# Patient Record
Sex: Female | Born: 1990 | Race: White | Hispanic: No | Marital: Single | Smoking: Current every day smoker
Health system: Southern US, Community
[De-identification: ages and names within clinical notes are randomized; demographics above are authoritative.]

## PROBLEM LIST (undated history)

## (undated) DIAGNOSIS — M40209 Unspecified kyphosis, site unspecified: Secondary | ICD-10-CM

## (undated) HISTORY — PX: TONSILLECTOMY: SUR1361

---

## 2010-05-16 ENCOUNTER — Emergency Department: Payer: Self-pay | Admitting: Emergency Medicine

## 2010-05-16 ENCOUNTER — Ambulatory Visit: Payer: Self-pay | Admitting: Internal Medicine

## 2011-02-08 DIAGNOSIS — M419 Scoliosis, unspecified: Secondary | ICD-10-CM | POA: Insufficient documentation

## 2011-02-08 DIAGNOSIS — F17200 Nicotine dependence, unspecified, uncomplicated: Secondary | ICD-10-CM | POA: Diagnosis present

## 2012-02-26 IMAGING — CT CT ABD-PELV W/ CM
1 of 2 series · 15 of 32 positions shown, 19 images · IV contrast (isovue)
Comparison: none

REASON FOR EXAM: (1) pain; (2) pain;    NOTE: Nursing to Give Oral CT
Contrast
COMMENTS:

PROCEDURE:     CT  - CT ABDOMEN / PELVIS  W  - May 16, 2010  [DATE]
RESULT:     Comparison:  None
TECHNIQUE: Multiple axial images of the abdomen and pelvis were performed
from the lung bases to the pubic symphysis, with p.o. contrast and with 100
mL of Isovue 370 intravenous contrast.

[Series 2: appendicitis · axial · 0.65mm/px · z∈[-487,-55]mm · 15 of 158 slices shown, 19 images]
[im 7/158  soft-tissue]
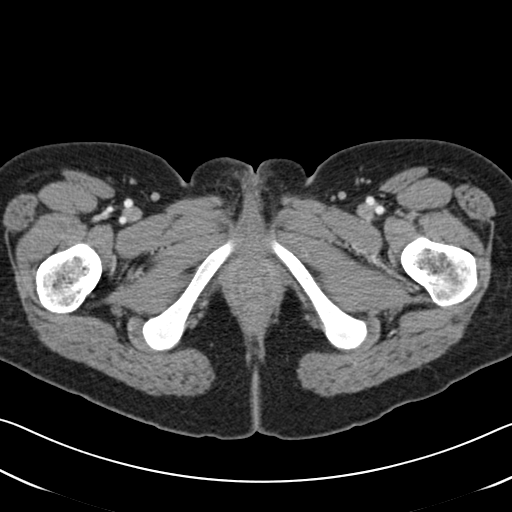
[im 7/158  bone]
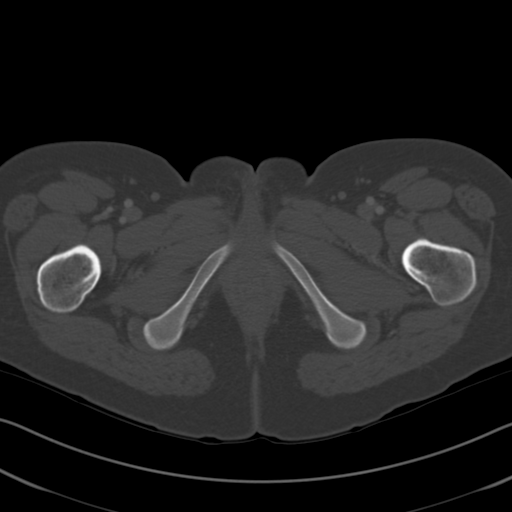
[im 21/158  soft-tissue]
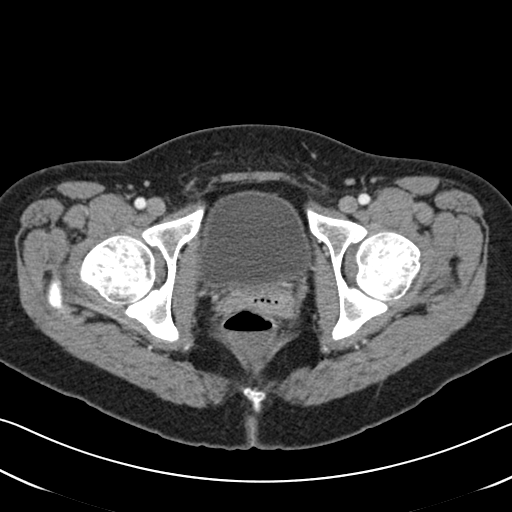
[im 35/158  soft-tissue]
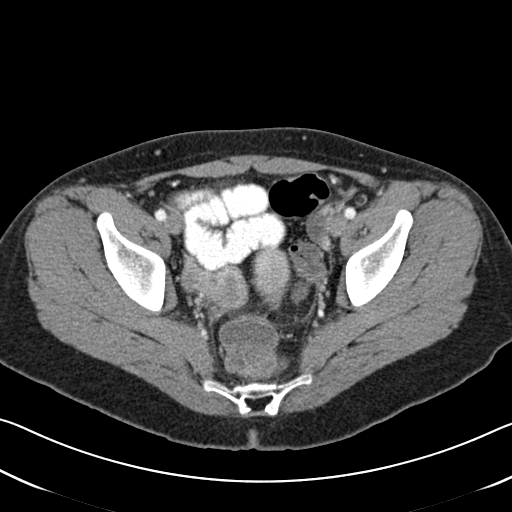
[im 41/158  soft-tissue]
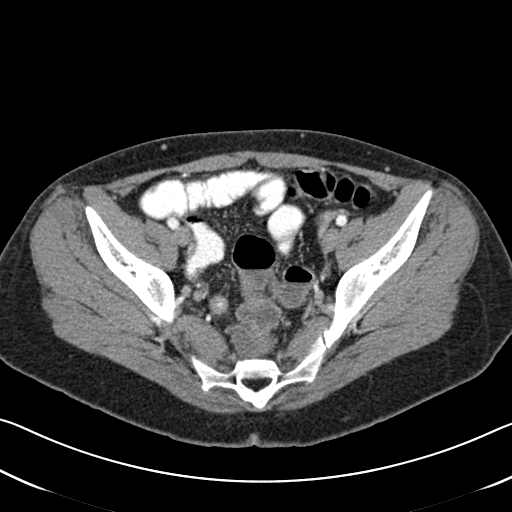
[im 55/158  soft-tissue]
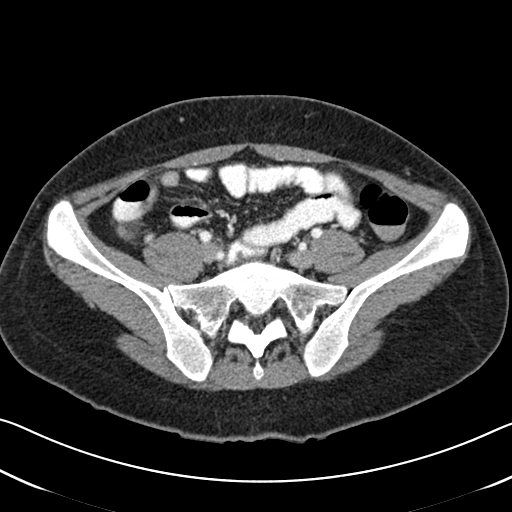
[im 69/158  soft-tissue]
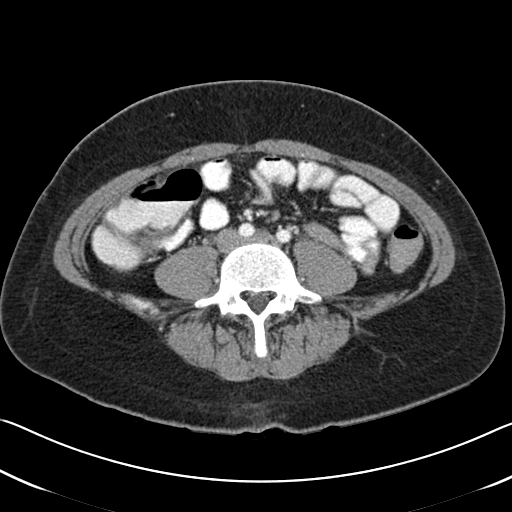
[im 82/158  soft-tissue]
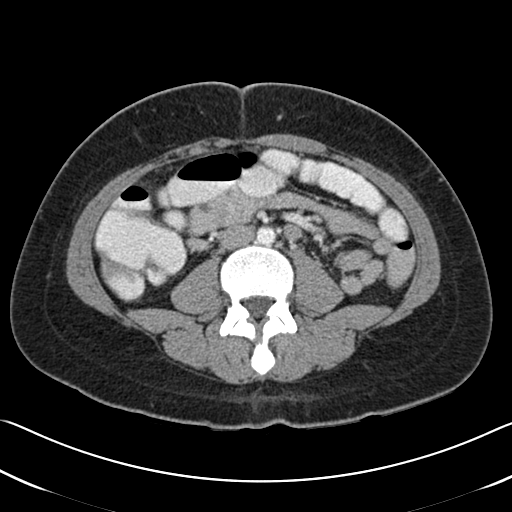
[im 89/158  soft-tissue]
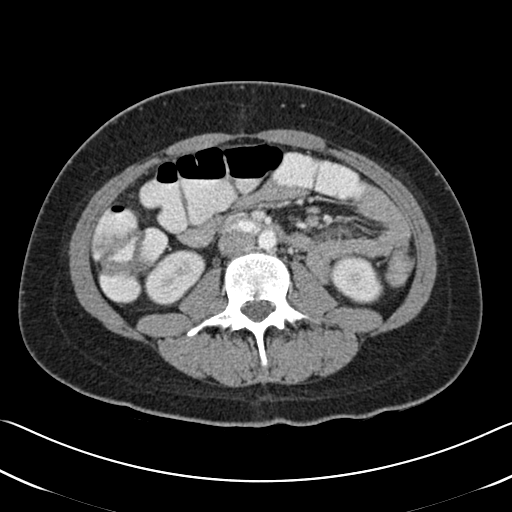
[im 103/158  soft-tissue]
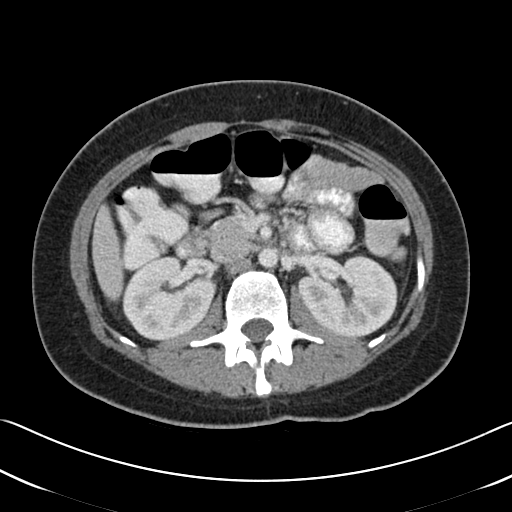
[im 103/158  bone]
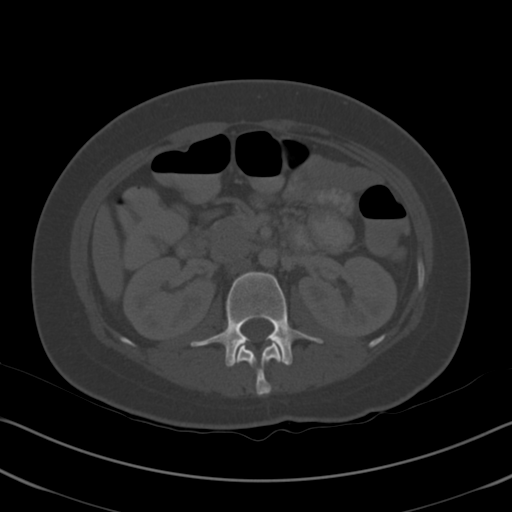
[im 117/158  soft-tissue]
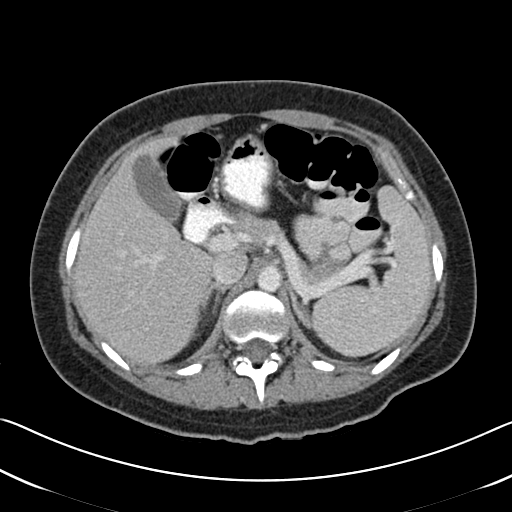
[im 123/158  soft-tissue]
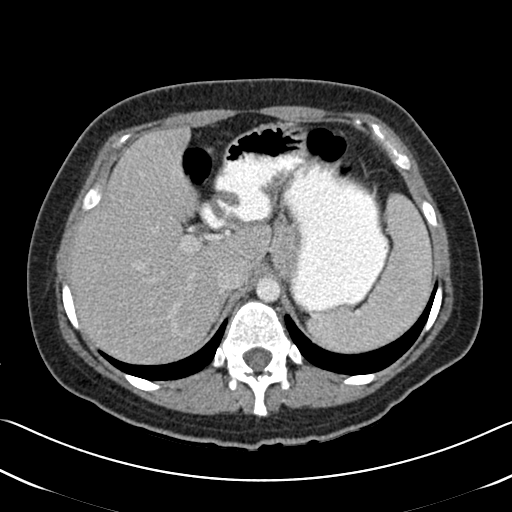
[im 130/158  lung]
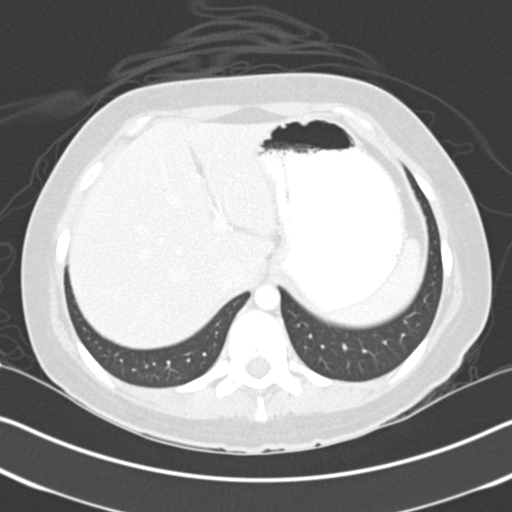
[im 137/158  soft-tissue]
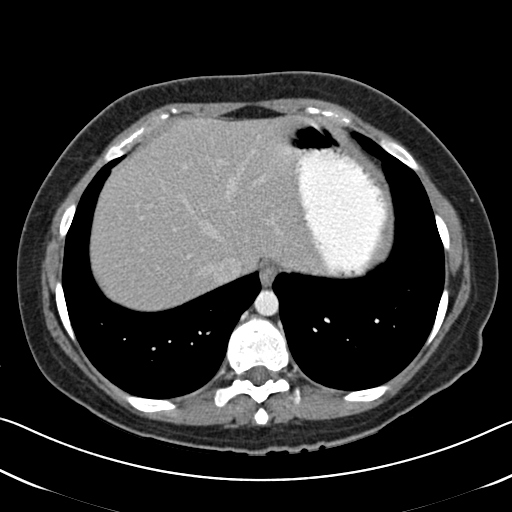
[im 137/158  lung]
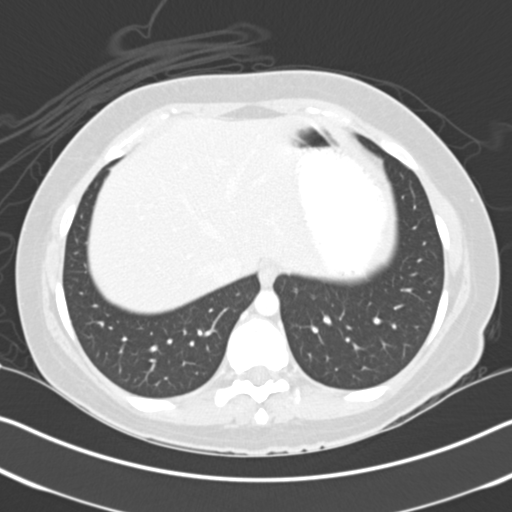
[im 144/158  lung]
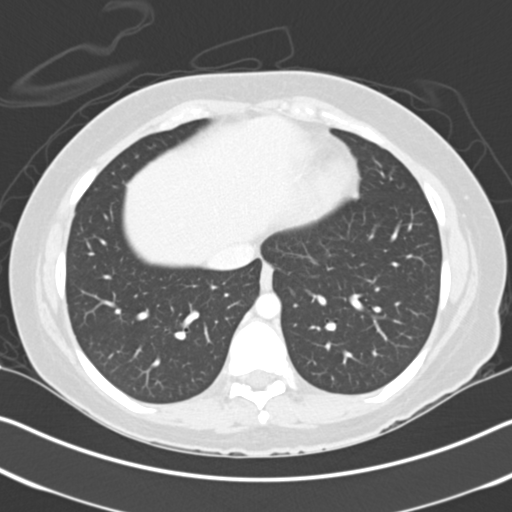
[im 151/158  soft-tissue]
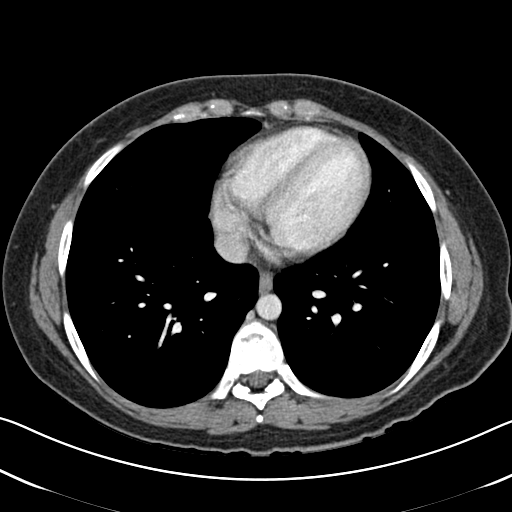
[im 151/158  lung]
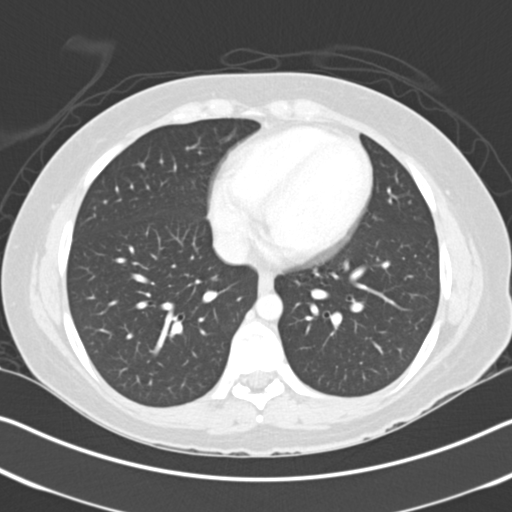

[15 of 32 positions shown; findings below may reference images not displayed]

FINDINGS: Minimal low-attenuation along the falciform ligament likely represents focal
fatty deposition. Small low-attenuation lesion right hepatic lobe is too
small to characterize. The gallbladder, spleen, adrenals, and pancreas are
unremarkable. The kidneys enhance normally. No hydronephrosis.

The small and large bowel are normal in caliber. The appendix measures 6 mm
in diameter, which is at the upper limits of normal in size. There are no
adjacent inflammatory changes. There is a trace amount of free fluid in the
pelvis, which is likely physiologic.

No aggressive lytic or sclerotic osseous lesions identified.
IMPRESSION: 1. The appendix is at the upper limits normal in size. However, there are no
adjacent inflammatory changes. Correlate clinically for early appendicitis.
2. Otherwise, no acute findings in the abdomen or pelvis.

The preliminary report was faxed to the ordering clinician at the time of
the study.

## 2014-11-09 ENCOUNTER — Emergency Department
Admission: EM | Admit: 2014-11-09 | Discharge: 2014-11-09 | Disposition: A | Discharge status: Home / Self Care | Payer: BLUE CROSS/BLUE SHIELD | Attending: Emergency Medicine | Admitting: Emergency Medicine

## 2014-11-09 ENCOUNTER — Encounter: Payer: Self-pay | Admitting: Emergency Medicine

## 2014-11-09 DIAGNOSIS — Z3202 Encounter for pregnancy test, result negative: Secondary | ICD-10-CM | POA: Insufficient documentation

## 2014-11-09 DIAGNOSIS — R509 Fever, unspecified: Secondary | ICD-10-CM

## 2014-11-09 DIAGNOSIS — R000 Tachycardia, unspecified: Secondary | ICD-10-CM | POA: Insufficient documentation

## 2014-11-09 DIAGNOSIS — Z720 Tobacco use: Secondary | ICD-10-CM | POA: Diagnosis not present

## 2014-11-09 DIAGNOSIS — E860 Dehydration: Secondary | ICD-10-CM | POA: Diagnosis not present

## 2014-11-09 DIAGNOSIS — N3090 Cystitis, unspecified without hematuria: Secondary | ICD-10-CM | POA: Diagnosis not present

## 2014-11-09 DIAGNOSIS — R251 Tremor, unspecified: Secondary | ICD-10-CM | POA: Diagnosis present

## 2014-11-09 LAB — CBC WITH DIFFERENTIAL/PLATELET
BASOS ABS: 0 10*3/uL (ref 0–0.1)
BASOS PCT: 0 %
EOS ABS: 0 10*3/uL (ref 0–0.7)
EOS PCT: 1 %
HCT: 35.7 % (ref 35.0–47.0)
HEMOGLOBIN: 12.2 g/dL (ref 12.0–16.0)
LYMPHS PCT: 13 %
Lymphs Abs: 1 10*3/uL (ref 1.0–3.6)
MCH: 31.2 pg (ref 26.0–34.0)
MCHC: 34.3 g/dL (ref 32.0–36.0)
MCV: 90.9 fL (ref 80.0–100.0)
Monocytes Absolute: 0.1 10*3/uL — ABNORMAL LOW (ref 0.2–0.9)
Monocytes Relative: 1 %
NEUTROS ABS: 6.6 10*3/uL — AB (ref 1.4–6.5)
Neutrophils Relative %: 85 %
PLATELETS: 186 10*3/uL (ref 150–440)
RBC: 3.92 MIL/uL (ref 3.80–5.20)
RDW: 12.4 % (ref 11.5–14.5)
WBC: 7.9 10*3/uL (ref 3.6–11.0)

## 2014-11-09 LAB — COMPREHENSIVE METABOLIC PANEL
ALBUMIN: 3.6 g/dL (ref 3.5–5.0)
ALK PHOS: 89 U/L (ref 38–126)
ALT: 63 U/L — AB (ref 14–54)
AST: 114 U/L — AB (ref 15–41)
Anion gap: 4 — ABNORMAL LOW (ref 5–15)
BUN: 10 mg/dL (ref 6–20)
CALCIUM: 8.2 mg/dL — AB (ref 8.9–10.3)
CHLORIDE: 111 mmol/L (ref 101–111)
CO2: 24 mmol/L (ref 22–32)
CREATININE: 0.85 mg/dL (ref 0.44–1.00)
GFR calc non Af Amer: >60 mL/min (ref >60)
GLUCOSE: 104 mg/dL — AB (ref 65–99)
Potassium: 3.6 mmol/L (ref 3.5–5.1)
SODIUM: 139 mmol/L (ref 135–145)
Total Bilirubin: 0.7 mg/dL (ref 0.3–1.2)
Total Protein: 6.3 g/dL — ABNORMAL LOW (ref 6.5–8.1)

## 2014-11-09 LAB — URINALYSIS COMPLETE WITH MICROSCOPIC (ARMC ONLY)
Bilirubin Urine: NEGATIVE
GLUCOSE, UA: NEGATIVE mg/dL
Ketones, ur: NEGATIVE mg/dL
Nitrite: NEGATIVE
PROTEIN: 30 mg/dL — AB
SPECIFIC GRAVITY, URINE: 1.019 (ref 1.005–1.030)
pH: 5 (ref 5.0–8.0)

## 2014-11-09 LAB — PREGNANCY, URINE: PREG TEST UR: NEGATIVE

## 2014-11-09 MED ORDER — KETOROLAC TROMETHAMINE 30 MG/ML IJ SOLN
30.0000 mg | Freq: Once | INTRAMUSCULAR | Status: AC
  Administered 2014-11-09: 30 mg via INTRAVENOUS

## 2014-11-09 MED ORDER — DEXTROSE 5 % IV SOLN
1.0000 g | Freq: Once | INTRAVENOUS | Status: AC
  Administered 2014-11-09: 1 g via INTRAVENOUS
  Filled 2014-11-09: qty 10

## 2014-11-09 MED ORDER — SULFAMETHOXAZOLE-TRIMETHOPRIM 800-160 MG PO TABS: 1.0000 | ORAL_TABLET | Freq: Two times a day (BID) | ORAL | Status: DC

## 2014-11-09 MED ORDER — KETOROLAC TROMETHAMINE 30 MG/ML IJ SOLN
INTRAMUSCULAR | Status: AC
Start: 2014-11-09 — End: 2014-11-09
  Administered 2014-11-09: 30 mg via INTRAVENOUS
  Filled 2014-11-09: qty 1

## 2014-11-09 MED ORDER — SODIUM CHLORIDE 0.9 % IV BOLUS (SEPSIS)
1000.0000 mL | Freq: Once | INTRAVENOUS | Status: AC
  Administered 2014-11-09: 1000 mL via INTRAVENOUS

## 2014-11-09 MED ORDER — ONDANSETRON HCL 4 MG/2ML IJ SOLN
INTRAMUSCULAR | Status: DC
Start: 2014-11-09 — End: 2014-11-09
  Filled 2014-11-09: qty 2

## 2014-11-09 NOTE — ED Notes (Signed)
Pt. Walked into triage with mother.   Pt. States waking up about half hour ago shaking for about 30 minutes.  Pt. States shaking stopped in route to ED.  Pt. States having a fever before going to bed tonight.  Pt. States taking some Nyquil before going to bed.  Pt. States mild lower lt. Back pain early today.

## 2014-11-09 NOTE — ED Notes (Signed)
MD stafford at bedside. 

## 2014-11-09 NOTE — ED Notes (Signed)
POC PREG NEGATIVE  

## 2014-11-09 NOTE — ED Notes (Signed)
Pt given urine cup and wipe, instructed upon how to use. Verbalized understanding, will let RN know once able to provide sample.

## 2014-11-09 NOTE — Discharge Instructions (Signed)
Fever, Adult A fever is an increase in the body's temperature. It is often defined as a temperature of 100 F (38C) or higher. Short mild or moderate fevers often have no long-term effects. They also often do not need treatment. Moderate or high fevers may make you feel uncomfortable. Sometimes, they can also be a sign of a serious illness or disease. The sweating that may happen with repeated fevers or fevers that last a while may also cause you to not have enough fluid in your body (dehydration). You can take your temperature with a thermometer to see if you have a fever. A measured temperature can change with:  Age.  Time of day.  Where the thermometer is placed:  Mouth (oral).  Rectum (rectal).  Ear (tympanic).  Underarm (axillary).  Forehead (temporal). HOME CARE Pay attention to any changes in your symptoms. Take these actions to help with your condition:  Take over-the-counter and prescription medicines only as told by your doctor. Follow the dosing instructions carefully.  If you were prescribed an antibiotic medicine, take it as told by your doctor. Do not stop taking the antibiotic even if you start to feel better.  Rest as needed.  Drink enough fluid to keep your pee (urine) clear or pale yellow.  Sponge yourself or bathe with room-temperature water as needed. This helps to lower your body temperature . Do not use ice water.  Do not wear too many blankets or heavy clothes. GET HELP IF:  You throw up (vomit).  You cannot eat or drink without throwing up.  You have watery poop (diarrhea).  It hurts when you pee.  Your symptoms do not get better with treatment.  You have new symptoms.  You feel very weak. GET HELP RIGHT AWAY IF:  You are short of breath or have trouble breathing.  You are dizzy or you pass out (faint).  You feel confused.  You have signs of not having enough fluid in your body, such as:  A dry mouth.  Peeing less.  Looking  pale.  You have very bad pain in your belly (abdomen).  You keep throwing up or having water poop.  You have a skin rash.  Your symptoms suddenly get worse.   This information is not intended to replace advice given to you by your health care provider. Make sure you discuss any questions you have with your health care provider.   Document Released: 10/07/2007 Document Revised: 09/18/2014 Document Reviewed: 02/21/2014 Elsevier Interactive Patient Education 2016 Else[employer] Inc[employer]  Dehydration, Adult Dehydration is a condition in which you do not have enough fluid or water in your body. It happens when you take in less fluid than you lose. Vital organs such as the kidneys, brain, and heart cannot function without a proper amount of fluids. Any loss of fluids from the body can cause dehydration.  Dehydration can range from mild to severe. This condition should be treated right away to help prevent it from becoming severe. CAUSES  This condition may be caused by:  Vomiting.  Diarrhea.  Excessive sweating, such as when exercising in hot or humid weather.  Not drinking enough fluid during strenuous exercise or during an illness.  Excessive urine output.  Fever.  Certain medicines. RISK FACTORS This condition is more likely to develop in:  People who are taking certain medicines that cause the body to lose excess fluid (diuretics).   People who have a chronic illness, such as diabetes, that may increase urination.  Older  adults.   People who live at high altitudes.   People who participate in endurance sports.  SYMPTOMS  Mild Dehydration  Thirst.  Dry lips.  Slightly dry mouth.  Dry, warm skin. Moderate Dehydration  Very dry mouth.   Muscle cramps.   Dark urine and decreased urine production.   Decreased tear production.   Headache.   Light-headedness, especially when you stand up from a sitting position.  Severe Dehydration  Changes in skin.    Cold and clammy skin.   Skin does not spring back quickly when lightly pinched and released.   Changes in body fluids.   Extreme thirst.   No tears.   Not able to sweat when body temperature is high, such as in hot weather.   Minimal urine production.   Changes in vital signs.   Rapid, weak pulse (more than 100 beats per minute when you are sitting still).   Rapid breathing.   Low blood pressure.   Other changes.   Sunken eyes.   Cold hands and feet.   Confusion.  Lethargy and difficulty being awakened.  Fainting (syncope).   Short-term weight loss.   Unconsciousness. DIAGNOSIS  This condition may be diagnosed based on your symptoms. You may also have tests to determine how severe your dehydration is. These tests may include:   Urine tests.   Blood tests.  TREATMENT  Treatment for this condition depends on the severity. Mild or moderate dehydration can often be treated at home. Treatment should be started right away. Do not wait until dehydration becomes severe. Severe dehydration needs to be treated at the hospital. Treatment for Mild Dehydration  Drinking plenty of water to replace the fluid you have lost.   Replacing minerals in your blood (electrolytes) that you may have lost.  Treatment for Moderate Dehydration  Consuming oral rehydration solution (ORS). Treatment for Severe Dehydration  Receiving fluid through an IV tube.   Receiving electrolyte solution through a feeding tube that is passed through your nose and into your stomach (nasogastric tube or NG tube).  Correcting any abnormalities in electrolytes. HOME CARE INSTRUCTIONS   Drink enough fluid to keep your urine clear or pale yellow.   Drink water or fluid slowly by taking small sips. You can also try sucking on ice cubes.  Have food or beverages that contain electrolytes. Examples include bananas and sports drinks.  Take over-the-counter and prescription  medicines only as told by your health care provider.   Prepare ORS according to the manufacturer's instructions. Take sips of ORS every 5 minutes until your urine returns to normal.  If you have vomiting or diarrhea, continue to try to drink water, ORS, or both.   If you have diarrhea, avoid:   Beverages that contain caffeine.   Fruit juice.   Milk.   Carbonated soft drinks.  Do not take salt tablets. This can lead to the condition of having too much sodium in your body (hypernatremia).  SEEK MEDICAL CARE IF:  You cannot eat or drink without vomiting.  You have had moderate diarrhea during a period of more than 24 hours.  You have a fever. SEEK IMMEDIATE MEDICAL CARE IF:   You have extreme thirst.  You have severe diarrhea.  You have not urinated in 6-8 hours, or you have urinated only a small amount of very dark urine.  You have shriveled skin.  You are dizzy, confused, or both.   This information is not intended to replace advice given to you  by your health care provider. Make sure you discuss any questions you have with your health care provider.   Document Released: 12/28/2004 Document Revised: 09/18/2014 Document Reviewed: 05/15/2014 Elsevier Interactive Patient Education 2016 Elsevier Inc.  Urinary Tract Infection Urinary tract infections (UTIs) can develop anywhere along your urinary tract. Your urinary tract is your body's drainage system for removing wastes and extra water. Your urinary tract includes two kidneys, two ureters, a bladder, and a urethra. Your kidneys are a pair of bean-shaped organs. Each kidney is about the size of your fist. They are located below your ribs, one on each side of your spine. CAUSES Infections are caused by microbes, which are microscopic organisms, including fungi, viruses, and bacteria. These organisms are so small that they can only be seen through a microscope. Bacteria are the microbes that most commonly cause  UTIs. SYMPTOMS  Symptoms of UTIs may vary by age and gender of the patient and by the location of the infection. Symptoms in young women typically include a frequent and intense urge to urinate and a painful, burning feeling in the bladder or urethra during urination. Older women and men are more likely to be tired, shaky, and weak and have muscle aches and abdominal pain. A fever may mean the infection is in your kidneys. Other symptoms of a kidney infection include pain in your back or sides below the ribs, nausea, and vomiting. DIAGNOSIS To diagnose a UTI, your caregiver will ask you about your symptoms. Your caregiver will also ask you to provide a urine sample. The urine sample will be tested for bacteria and white blood cells. White blood cells are made by your body to help fight infection. TREATMENT  Typically, UTIs can be treated with medication. Because most UTIs are caused by a bacterial infection, they usually can be treated with the use of antibiotics. The choice of antibiotic and length of treatment depend on your symptoms and the type of bacteria causing your infection. HOME CARE INSTRUCTIONS  If you were prescribed antibiotics, take them exactly as your caregiver instructs you. Finish the medication even if you feel better after you have only taken some of the medication.  Drink enough water and fluids to keep your urine clear or pale yellow.  Avoid caffeine, tea, and carbonated beverages. They tend to irritate your bladder.  Empty your bladder often. Avoid holding urine for long periods of time.  Empty your bladder before and after sexual intercourse.  After a bowel movement, women should cleanse from front to back. Use each tissue only once. SEEK MEDICAL CARE IF:   You have back pain.  You develop a fever.  Your symptoms do not begin to resolve within 3 days. SEEK IMMEDIATE MEDICAL CARE IF:   You have severe back pain or lower abdominal pain.  You develop  chills.  You have nausea or vomiting.  You have continued burning or discomfort with urination. MAKE SURE YOU:   Understand these instructions.  Will watch your condition.  Will get help right away if you are not doing well or get worse.   This information is not intended to replace advice given to you by your health care provider. Make sure you discuss any questions you have with your health care provider.   Document Released: 10/07/2004 Document Revised: 09/18/2014 Document Reviewed: 02/05/2011 Elsevier Interactive Patient Education [employer] Elsevier Inc.

## 2014-11-09 NOTE — ED Provider Notes (Signed)
[facility] Emergency Department Provider Note  ____________________________________________  Time seen: 2:00 AM  I have reviewed the triage vital signs and the nursing notes.   HISTORY  Chief Complaint Shaking    HPI Katie Watson is a 24 y.o. female who reports a fever when going to bed tonight associated with some lower back pain. She took some NyQuil but then woke up shortly before presentation to the emergency department shaking. Denies dysuria frequency urgency hematuria. Has had history of urinary tract infections. No other symptoms recently.     No past medical history on file.   There are no active problems to display for this patient.    Past Surgical History  Procedure Laterality Date  . Tonsillectomy       Current Outpatient Rx  Name  Route  Sig  Dispense  Refill  . Aspirin-Salicylamide-Caffeine (BC HEADACHE POWDER PO)   Oral   Take 1 Dose by mouth daily as needed.         . etonogestrel (NEXPLANON) 68 MG IMPL implant   Intradermal   Inject 1 each into the skin as directed.         . sulfamethoxazole-trimethoprim (BACTRIM DS) 800-160 MG tablet   Oral   Take 1 tablet by mouth 2 (two) times daily.   14 tablet   0      Allergies Pertussis immune globulin   No family history on file.  Social History Social History  Substance Use Topics  . Smoking status: Current Every Day Smoker -- 0.50 packs/day    Types: Cigarettes  . Smokeless tobacco: None  . Alcohol Use: Yes    Review of Systems  Constitutional:   Positive fever and chills.. No weight changes Eyes:   No blurry vision or double vision.  ENT:   No sore throat. Cardiovascular:   No chest pain. Respiratory:   No dyspnea or cough. Gastrointestinal:   Negative for abdominal pain, vomiting and diarrhea.  No BRBPR or melena. Genitourinary:   Negative for dysuria, urinary retention, bloody urine, or difficulty urinating. Musculoskeletal:   Positive low  back pain. Skin:   Negative for rash. Neurological:   Negative for headaches, focal weakness or numbness. Psychiatric:  No anxiety or depression.   Endocrine:  No hot/cold intolerance, changes in energy, or sleep difficulty.  10-point ROS otherwise negative.  ____________________________________________   PHYSICAL EXAM:  VITAL SIGNS: ED Triage Vitals  Enc Vitals Group     BP 11/09/14 0138 125/74 mmHg     Pulse Rate 11/09/14 0138 110     Resp 11/09/14 0138 18     Temp 11/09/14 0146 100.9 F (38.3 C)     Temp Source 11/09/14 0146 Oral     SpO2 11/09/14 0138 99 %     Weight 11/09/14 0138 176 lb (79.833 kg)     Height 11/09/14 0138 5\' 5"  (1.651 m)     Head Cir --      Peak Flow --      Pain Score --      Pain Loc --      Pain Edu? --      Excl. in GC? --      Constitutional:   Alert and oriented. Well appearing and in no distress. Eyes:   No scleral icterus. No conjunctival pallor. PERRL. EOMI ENT   Head:   Normocephalic and atraumatic.   Nose:   No congestion/rhinnorhea. No septal hematoma   Mouth/Throat:   Dry mucous membranes,  no pharyngeal erythema. No peritonsillar mass. No uvula shift.   Neck:   No stridor. No SubQ emphysema. No meningismus. Hematological/Lymphatic/Immunilogical:   No cervical lymphadenopathy. Cardiovascular:   Tachycardia heart rate 110, increases to 125 when sitting upright. Normal and symmetric distal pulses are present in all extremities. No murmurs, rubs, or gallops. Respiratory:   Normal respiratory effort without tachypnea nor retractions. Breath sounds are clear and equal bilaterally. No wheezes/rales/rhonchi. Gastrointestinal:   Soft with marked. Suprapubic tenderness. No distention. There is no CVA tenderness.  No rebound, rigidity, or guarding. Genitourinary:   deferred Musculoskeletal:   Nontender with normal range of motion in all extremities. No joint effusions.  No lower extremity tenderness.  No edema. Neurologic:    Normal speech and language.  CN 2-10 normal. Motor grossly intact. No pronator drift.  Normal gait. No gross focal neurologic deficits are appreciated.  Skin:    Skin is warm, dry and intact. No rash noted.  No petechiae, purpura, or bullae. Psychiatric:   Mood and affect are normal. Speech and behavior are normal. Patient exhibits appropriate insight and judgment.  ____________________________________________    LABS (pertinent positives/negatives) (all labs ordered are listed, but only abnormal results are displayed) Labs Reviewed  URINALYSIS COMPLETEWITH MICROSCOPIC ([facility] ONLY) - Abnormal; Notable for the following:    Color, Urine YELLOW (*)    APPearance HAZY (*)    Hgb urine dipstick 3+ (*)    Protein, ur 30 (*)    Leukocytes, UA 1+ (*)    Bacteria, UA RARE (*)    Squamous Epithelial / LPF 6-30 (*)    All other components within normal limits  CBC WITH DIFFERENTIAL/PLATELET - Abnormal; Notable for the following:    Neutro Abs 6.6 (*)    Monocytes Absolute 0.1 (*)    All other components within normal limits  COMPREHENSIVE METABOLIC PANEL - Abnormal; Notable for the following:    Glucose, Bld 104 (*)    Calcium 8.2 (*)    Total Protein 6.3 (*)    AST 114 (*)    ALT 63 (*)    Anion gap 4 (*)    All other components within normal limits  URINE CULTURE  PREGNANCY, URINE   urine white blood cells seen numerous to count ____________________________________________   EKG    ____________________________________________    RADIOLOGY    ____________________________________________   PROCEDURES   ____________________________________________   INITIAL IMPRESSION / ASSESSMENT AND PLAN / ED COURSE  Pertinent labs & imaging results that were available during my care of the patient were reviewed by me and considered in my medical decision making (see chart for details).  Patient presents with fever tachycardia and exam highly suggestive of cystitis and  urinary tract infection. Exam is not consistent with pyelonephritis. She does appear to be dehydrated and by history she does not drink a lot of fluids. She is overall very well appearing and young and healthy, so despite the vital sign abnormalities I do not think that she is septic at this time. We'll check labs and give IV fluids.  ----------------------------------------- 4:23 AM on 11/09/2014 -----------------------------------------  Patient very comfortable and well-appearing. Urinalysis shows a clear urinary tract infection, and labs are unremarkable. Kidney function is unremarkable and there is no leukocytosis. Patient given 1 g IV ceftriaxone and urine culture sent. We'll start her on course of Bactrim and have her follow up with primary care in a week. She is in no distress and suitable for outpatient follow-up.  ____________________________________________   FINAL CLINICAL IMPRESSION(S) / ED DIAGNOSES  Final diagnoses:  Cystitis  Fever, unspecified fever cause  Dehydration      Sharman Cheek, MD 11/09/14 [phone]

## 2014-11-11 LAB — URINE CULTURE: Culture: >=100000

## 2016-08-19 ENCOUNTER — Ambulatory Visit
Admission: EM | Admit: 2016-08-19 | Discharge: 2016-08-19 | Disposition: A | Discharge status: Home / Self Care | Payer: Managed Care, Other (non HMO) | Attending: Family Medicine | Admitting: Family Medicine

## 2016-08-19 DIAGNOSIS — M62830 Muscle spasm of back: Secondary | ICD-10-CM

## 2016-08-19 DIAGNOSIS — M5441 Lumbago with sciatica, right side: Secondary | ICD-10-CM

## 2016-08-19 HISTORY — DX: Unspecified kyphosis, site unspecified: M40.209

## 2016-08-19 MED ORDER — ORPHENADRINE CITRATE ER 100 MG PO TB12: 100.0000 mg | ORAL_TABLET | Freq: Two times a day (BID) | ORAL | 0 refills | Status: DC

## 2016-08-19 MED ORDER — MELOXICAM 15 MG PO TABS: 15.0000 mg | ORAL_TABLET | Freq: Every day | ORAL | 0 refills | Status: DC

## 2016-08-19 MED ORDER — KETOROLAC TROMETHAMINE 60 MG/2ML IM SOLN
60.0000 mg | Freq: Once | INTRAMUSCULAR | Status: AC
  Administered 2016-08-19: 60 mg via INTRAMUSCULAR

## 2016-08-19 MED ORDER — PREDNISONE 10 MG (21) PO TBPK: ORAL_TABLET | ORAL | 0 refills | Status: DC

## 2016-08-19 NOTE — ED Provider Notes (Signed)
[facility]    CSN: [id number] Arrival date & time: 08/19/16  1113     History   Chief Complaint Chief Complaint  Patient presents with  . Back Pain    HPI Katie Watson is a 26 y.o. female.   Patient reports back pain. She reports having a history of chronic back pain since high school over 10 years ago. She seemed back experts she was told that she has scoliosis of the spine and is concerned about the way she carried a book bag high school with that may have contributed to her problems. In the last 2 years she's had recurrent trouble with her back. She's had difficulty doing her usual activities and now has gotten worse. 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 Initially she took Motrin that she took Motrin and Tylenol and now she is on [facility] powder to for relief of pain and last longer working. She reports pains in the right side of the back. She reports that is 8 out of 10 now and when she wants at work yesterday it was a 10 out of 10. She does be working and was unable to work because of the amount of pain that she's having her back. Unfortunately she does smoke. She is allergic to tramadol and pertussis immune globulin. No pertinent family medical history relevant to today's visit.   The history is provided by the patient. No language interpreter was used.  Back Pain  Location:  Thoracic spine Quality:  Aching, burning and shooting Radiates to:  R shoulder Pain severity:  Severe Worse during: wrse aftr working. Onset quality:  Sudden Timing:  Constant Progression:  Worsening Chronicity:  New Relieved by:  Nothing Worsened by:  Bending and movement Ineffective treatments:  NSAIDs and OTC medications Associated symptoms: numbness and paresthesias     Past Medical History:  Diagnosis Date  . Kyphosis deformity of spine     There are no active problems to display for this patient.   Past Surgical History:   Procedure Laterality Date  . TONSILLECTOMY      OB History    No data available       Home Medications    Prior to Admission medications   Medication Sig Start Date End Date Taking? Authorizing Provider  Aspirin-Salicylamide-Caffeine (BC HEADACHE POWDER PO) Take 1 Dose by mouth daily as needed.   Yes [provider]  etonogestrel (NEXPLANON) 68 MG IMPL implant Inject 1 each into the skin as directed.    [provider]  meloxicam (MOBIC) 15 MG tablet Take 1 tablet (15 mg total) by mouth daily. 08/19/16   Hassan Rowan, MD  orphenadrine (NORFLEX) 100 MG tablet Take 1 tablet (100 mg total) by mouth 2 (two) times daily. 08/19/16   Hassan Rowan, MD  predniSONE (STERAPRED UNI-PAK 21 TAB) 10 MG (21) TBPK tablet 6 tabs day 1 and 2, 5 tabs day 3 and 4, 4 tabs day 5 and 6, 3 tabs day 7 and 8, 2 tabs day 9 and 10, 1 tab day 11 and 12. Take orally 08/19/16   Hassan Rowan, MD  sulfamethoxazole-trimethoprim (BACTRIM DS) 800-160 MG tablet Take 1 tablet by mouth 2 (two) times daily. 11/09/14   Sharman Cheek, MD    Family History History reviewed. No pertinent family history.  Social History Social History  Substance Use Topics  . Smoking status: Current Every Day Smoker  Packs/day: 0.50    Types: Cigarettes  . Smokeless tobacco: Never Used  . Alcohol use No     Allergies   Pertussis immune globulin   Review of Systems Review of Systems  Musculoskeletal: Positive for back pain, gait problem and myalgias.  Neurological: Positive for numbness and paresthesias.  All other systems reviewed and are negative.    Physical Exam Triage Vital Signs ED Triage Vitals  Enc Vitals Group     BP 08/19/16 1210 117/78     Pulse Rate 08/19/16 1210 81     Resp 08/19/16 1210 17     Temp 08/19/16 1210 98.1 F (36.7 C)     Temp Source 08/19/16 1210 Oral     SpO2 08/19/16 1210 100 %     Weight 08/19/16 1208 162 lb (73.5 kg)     Height 08/19/16 1208 5\' 5"  (1.651 m)     Head  Circumference --      Peak Flow --      Pain Score 08/19/16 1208 8     Pain Loc --      Pain Edu? --      Excl. in GC? --    No data found.   Updated Vital Signs BP 117/78 (BP Location: Right Arm)   Pulse 81   Temp 98.1 F (36.7 C) (Oral)   Resp 17   Ht 5\' 5"  (1.651 m)   Wt 162 lb (73.5 kg)   LMP 08/01/2016   SpO2 100%   BMI 26.96 kg/m   Visual Acuity Right Eye Distance:   Left Eye Distance:   Bilateral Distance:    Right Eye Near:   Left Eye Near:    Bilateral Near:     Physical Exam  Constitutional: She is oriented to person, place, and time. She appears well-developed and well-nourished.  HENT:  Head: Normocephalic.  Eyes: Pupils are equal, round, and reactive to light.  Neck: Normal range of motion. Neck supple.  Pulmonary/Chest: Effort normal.  Musculoskeletal: She exhibits tenderness.       Back:       Arms: Patient with marked muscle spasms along the right thoracic area and lateral to the thoracic spine. To palpation and reproduces the pain and discomfort that she's been having  Neurological: She is alert and oriented to person, place, and time. No cranial nerve deficit.  Skin: She is not diaphoretic.  Psychiatric: She has a normal mood and affect.  Vitals reviewed.    UC Treatments / Results  Labs (all labs ordered are listed, but only abnormal results are displayed) Labs Reviewed - No data to display  EKG  EKG Interpretation None       Radiology No results found.  Procedures Procedures (including critical care time)  Medications Ordered in UC Medications  ketorolac (TORADOL) injection 60 mg (60 mg Intramuscular Given 08/19/16 1341)     Initial Impression / Assessment and Plan / UC Course  I have reviewed the triage vital signs and the nursing notes.  Pertinent labs & imaging results that were available during my care of the patient were reviewed by me and considered in my medical decision making (see chart for details).   will  administer. 60 Toradol IM since the pain is 8 out of 10, place on prednisone for 12 days not repeat x-ray because his been no trauma to the back or place or Norflex 100 mg twice a day Mobic 15 mg a day stopped Goody powders she may take Tylenol  extra strength thousand milligrams twice a day and I'm recommending chiropractic evaluation for possible manipulation of the spine to try to get alignment improved   Final Clinical Impressions(s) / UC Diagnoses   Final diagnoses:  Muscle spasm of back  Acute right-sided low back pain with right-sided sciatica    New Prescriptions Discharge Medication List as of 08/19/2016  2:08 PM    START taking these medications   Details  meloxicam (MOBIC) 15 MG tablet Take 1 tablet (15 mg total) by mouth daily., Starting Thu 08/19/2016, Normal    orphenadrine (NORFLEX) 100 MG tablet Take 1 tablet (100 mg total) by mouth 2 (two) times daily., Starting Thu 08/19/2016, Normal    predniSONE (STERAPRED UNI-PAK 21 TAB) 10 MG (21) TBPK tablet 6 tabs day 1 and 2, 5 tabs day 3 and 4, 4 tabs day 5 and 6, 3 tabs day 7 and 8, 2 tabs day 9 and 10, 1 tab day 11 and 12. Take orally, Normal         Controlled Substance Prescriptions [place] Controlled Substance Registry consulted? Not Applicable   Hassan Rowan, MD 08/19/16 [phone]

## 2016-08-19 NOTE — ED Triage Notes (Signed)
Patient complains of mid back pain. Patient states that she was diagnosed with hyperkyphosis in 2008. 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 Patient states that she has been using BC Powders frequently. Patient states that she almost feels a numbing sensation. Patient states that this has been constant forever but seems to have worsened recently.

## 2019-10-10 ENCOUNTER — Other Ambulatory Visit: Payer: Self-pay

## 2019-10-10 ENCOUNTER — Encounter: Payer: Self-pay | Admitting: Emergency Medicine

## 2019-10-10 ENCOUNTER — Ambulatory Visit
Admission: EM | Admit: 2019-10-10 | Discharge: 2019-10-10 | Disposition: A | Discharge status: Home / Self Care | Payer: Self-pay

## 2019-10-10 DIAGNOSIS — J340 Abscess, furuncle and carbuncle of nose: Secondary | ICD-10-CM

## 2019-10-10 MED ORDER — CLINDAMYCIN HCL 150 MG PO CAPS: 150.0000 mg | ORAL_CAPSULE | Freq: Four times a day (QID) | ORAL | 0 refills | Status: DC

## 2019-10-10 NOTE — ED Triage Notes (Signed)
Pt c/o facial swelling and pain. Started about 3 days ago. She states it felt like a pimple at first then it started making her top teeth hurt. She states she does not have any tooth pain now.

## 2019-10-10 NOTE — Discharge Instructions (Addendum)
Take the Clindamycin 4 times a day with food for 7 days.  Follow-up with Dr. Willeen Cass at [facility]. I have also sent a referral.   Apply warm compresses to the left side of your face for comfort.  If your pain increases, you develop more swelling, or a fever you need to go to the ER.
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
## 2020-01-01 DIAGNOSIS — F1120 Opioid dependence, uncomplicated: Secondary | ICD-10-CM | POA: Diagnosis present

## 2020-03-28 ENCOUNTER — Other Ambulatory Visit: Payer: Self-pay

## 2020-03-28 ENCOUNTER — Emergency Department
Admission: EM | Admit: 2020-03-28 | Discharge: 2020-03-28 | Disposition: A | Discharge status: Home / Self Care | Payer: Self-pay | Attending: Emergency Medicine | Admitting: Emergency Medicine

## 2020-03-28 DIAGNOSIS — Z0283 Encounter for blood-alcohol and blood-drug test: Secondary | ICD-10-CM

## 2020-03-28 DIAGNOSIS — F17210 Nicotine dependence, cigarettes, uncomplicated: Secondary | ICD-10-CM | POA: Insufficient documentation

## 2020-03-28 DIAGNOSIS — Z711 Person with feared health complaint in whom no diagnosis is made: Secondary | ICD-10-CM

## 2020-03-28 LAB — URINE DRUG SCREEN, QUALITATIVE (ARMC ONLY)
Amphetamines, Ur Screen: NOT DETECTED
Barbiturates, Ur Screen: NOT DETECTED
Benzodiazepine, Ur Scrn: NOT DETECTED
Cannabinoid 50 Ng, Ur ~~LOC~~: NOT DETECTED
Cocaine Metabolite,Ur ~~LOC~~: NOT DETECTED
MDMA (Ecstasy)Ur Screen: NOT DETECTED
Methadone Scn, Ur: NOT DETECTED
Opiate, Ur Screen: NOT DETECTED
Phencyclidine (PCP) Ur S: NOT DETECTED
Tricyclic, Ur Screen: NOT DETECTED
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
## 2020-03-28 NOTE — Discharge Instructions (Addendum)
Your urine drug screen was negative for any abnormal substances.

## 2020-05-07 DIAGNOSIS — J4520 Mild intermittent asthma, uncomplicated: Secondary | ICD-10-CM | POA: Insufficient documentation

## 2021-03-19 DIAGNOSIS — O0993 Supervision of high risk pregnancy, unspecified, third trimester: Secondary | ICD-10-CM

## 2021-05-14 ENCOUNTER — Other Ambulatory Visit: Payer: Self-pay | Admitting: Obstetrics and Gynecology

## 2021-05-14 DIAGNOSIS — Z3689 Encounter for other specified antenatal screening: Secondary | ICD-10-CM

## 2021-06-18 ENCOUNTER — Other Ambulatory Visit: Payer: Self-pay

## 2021-06-23 ENCOUNTER — Ambulatory Visit: Payer: Medicaid Other

## 2021-06-25 ENCOUNTER — Ambulatory Visit: Payer: Medicaid Other | Attending: Obstetrics

## 2021-06-25 ENCOUNTER — Other Ambulatory Visit: Payer: Self-pay | Admitting: Obstetrics and Gynecology

## 2021-06-25 ENCOUNTER — Institutional Professional Consult (permissible substitution): Payer: Self-pay

## 2021-06-25 ENCOUNTER — Other Ambulatory Visit: Payer: Self-pay

## 2021-06-25 ENCOUNTER — Ambulatory Visit (HOSPITAL_BASED_OUTPATIENT_CLINIC_OR_DEPARTMENT_OTHER): Payer: Medicaid Other | Admitting: Obstetrics

## 2021-06-25 DIAGNOSIS — Z363 Encounter for antenatal screening for malformations: Secondary | ICD-10-CM | POA: Insufficient documentation

## 2021-06-25 DIAGNOSIS — O99212 Obesity complicating pregnancy, second trimester: Secondary | ICD-10-CM

## 2021-06-25 DIAGNOSIS — Z3689 Encounter for other specified antenatal screening: Secondary | ICD-10-CM

## 2021-06-25 DIAGNOSIS — O99332 Smoking (tobacco) complicating pregnancy, second trimester: Secondary | ICD-10-CM | POA: Insufficient documentation

## 2021-06-25 DIAGNOSIS — O99891 Other specified diseases and conditions complicating pregnancy: Secondary | ICD-10-CM | POA: Insufficient documentation

## 2021-06-25 DIAGNOSIS — Z3A19 19 weeks gestation of pregnancy: Secondary | ICD-10-CM | POA: Diagnosis not present

## 2021-06-25 DIAGNOSIS — O99322 Drug use complicating pregnancy, second trimester: Secondary | ICD-10-CM

## 2021-06-25 NOTE — Progress Notes (Signed)
MFM Note  Katie Watson was seen for a detailed fetal anatomy scan due to maternal obesity with a BMI of 42.    She is currently on Suboxone replacement therapy.  She denies any significant past medical history and denies any problems in her current pregnancy.    She had a cell free DNA test earlier in her pregnancy which indicated a low risk for trisomy 45, 73, and 13. A female fetus is predicted.   She was informed that the fetal growth and amniotic fluid level were appropriate for her gestational age.   On today's exam, an intracardiac echogenic focus was noted in the left ventricle of the fetal heart.    The small association between an echogenic focus and Down syndrome was discussed. Due to the echogenic focus noted today, the patient was offered and declined an amniocentesis today for definitive diagnosis of fetal aneuploidy.  She reports that she is comfortable with her negative cell free DNA test.  The patient was informed that anomalies may be missed due to technical limitations. If the fetus is in a suboptimal position or maternal habitus is increased, visualization of the fetus in the maternal uterus may be impaired.  The following were discussed during today's consultation:  Suboxone treatment in pregnancy  The implications and management of Suboxone use in pregnancy was discussed in detail today.    She was advised that there is a risk that her baby may require prolonged hospitalization to be weaned off opioids after birth.  However, most babies born to mothers who receive Suboxone treatment during pregnancy have a relatively uncomplicated neonatal course.    Due to maternal treatment with Subutex, we will continue to follow her with growth ultrasounds throughout her pregnancy.  She should have a NICU consult prior to delivery to discuss how babies who are treated with Suboxone during pregnancy are managed after birth.  Obesity in pregnancy  The recommended total  weight gain in pregnancy for obese women is between 10 to 20 pounds.  Due to obesity, she should be screened for gestational diabetes at between 24 to 28 weeks.  As her BMI is greater than 40, weekly fetal testing should be started at 34 weeks.  As maternal obesity may present challenges associated with the management of anesthesia, an anesthesia consult should be obtained when she is admitted in labor.  The patient stated that all of her questions were answered today.    A follow-up exam was scheduled in 4 weeks to assess the fetal growth and to complete the views of the fetal anatomy which were limited today due to the fetal position.  A total of 30 minutes was spent counseling and coordinating the care for this patient.  Greater than 50% of the time was spent in direct face-to-face contact.

## 2021-07-19 ENCOUNTER — Other Ambulatory Visit: Payer: Self-pay

## 2021-07-19 DIAGNOSIS — O99322 Drug use complicating pregnancy, second trimester: Secondary | ICD-10-CM

## 2021-07-19 DIAGNOSIS — O99212 Obesity complicating pregnancy, second trimester: Secondary | ICD-10-CM

## 2021-07-21 ENCOUNTER — Ambulatory Visit: Payer: Medicaid Other

## 2021-07-21 NOTE — Progress Notes (Deleted)
Pt was a "No Show" to her MFM @ [facility] appt today.

## 2021-08-21 DIAGNOSIS — O09899 Supervision of other high risk pregnancies, unspecified trimester: Secondary | ICD-10-CM

## 2021-08-21 DIAGNOSIS — O99013 Anemia complicating pregnancy, third trimester: Secondary | ICD-10-CM | POA: Diagnosis present

## 2021-09-03 DIAGNOSIS — O283 Abnormal ultrasonic finding on antenatal screening of mother: Secondary | ICD-10-CM | POA: Insufficient documentation

## 2021-09-03 DIAGNOSIS — F1120 Opioid dependence, uncomplicated: Secondary | ICD-10-CM

## 2021-10-16 ENCOUNTER — Encounter: Payer: Self-pay | Admitting: Obstetrics

## 2021-10-17 ENCOUNTER — Other Ambulatory Visit: Payer: Self-pay | Admitting: Obstetrics

## 2021-10-17 DIAGNOSIS — D509 Iron deficiency anemia, unspecified: Secondary | ICD-10-CM

## 2021-10-17 NOTE — Progress Notes (Signed)
Hgb 9.9 in second trimester of pregnancy [redacted] weeks gestation IV Venofer order x 4 doses  Avelino Leeds CNM

## 2021-10-26 ENCOUNTER — Ambulatory Visit
Admission: RE | Admit: 2021-10-26 | Discharge: 2021-10-26 | Disposition: A | Discharge status: Home / Self Care | Payer: Medicaid Other | Source: Ambulatory Visit | Attending: Obstetrics | Admitting: Obstetrics

## 2021-10-26 DIAGNOSIS — Z3A37 37 weeks gestation of pregnancy: Secondary | ICD-10-CM | POA: Diagnosis not present

## 2021-10-26 DIAGNOSIS — O99013 Anemia complicating pregnancy, third trimester: Secondary | ICD-10-CM | POA: Insufficient documentation

## 2021-10-26 DIAGNOSIS — D509 Iron deficiency anemia, unspecified: Secondary | ICD-10-CM | POA: Diagnosis present

## 2021-10-26 MED ORDER — SODIUM CHLORIDE 0.9 % IV SOLN
300.0000 mg | Freq: Once | INTRAVENOUS | Status: AC
  Administered 2021-10-26: 300 mg via INTRAVENOUS
  Filled 2021-10-26: qty 300

## 2021-11-02 ENCOUNTER — Ambulatory Visit
Admission: RE | Admit: 2021-11-02 | Discharge: 2021-11-02 | Disposition: A | Discharge status: Home / Self Care | Payer: Medicaid Other | Source: Ambulatory Visit | Attending: Obstetrics | Admitting: Obstetrics

## 2021-11-02 DIAGNOSIS — D509 Iron deficiency anemia, unspecified: Secondary | ICD-10-CM | POA: Diagnosis not present

## 2021-11-02 DIAGNOSIS — O99012 Anemia complicating pregnancy, second trimester: Secondary | ICD-10-CM | POA: Diagnosis present

## 2021-11-02 MED ORDER — SODIUM CHLORIDE 0.9 % IV SOLN
300.0000 mg | Freq: Once | INTRAVENOUS | Status: AC
  Administered 2021-11-02: 300 mg via INTRAVENOUS
  Filled 2021-11-02: qty 300

## 2021-11-06 ENCOUNTER — Other Ambulatory Visit: Payer: Self-pay | Admitting: Obstetrics and Gynecology

## 2021-11-06 DIAGNOSIS — O99213 Obesity complicating pregnancy, third trimester: Secondary | ICD-10-CM | POA: Insufficient documentation

## 2021-11-06 NOTE — Progress Notes (Signed)
Dating: EDD: 11/11/21  by LMP: 12/20/20 and c/w [place] at 7+5 wks.   Preg c/b: Hx substance abuse, suboxone 8mg  daily Iron deficiency anemia Obesity BMI 40 Echogenic focus on anatomy US, normal materniT21 results.  Prediabetes, A1C 5.9->5.6 Tobacco use 1/2ppd ASthma Rubella NON-immune  Prenatal Labs: Blood type/Rh O pos  Antibody screen neg  Rubella NON-Immune  Varicella Immune  RPR NR  HBsAg Neg  HIV NR  GC neg  Chlamydia neg  Genetic screening negative  1 hour GTT 92  3 hour GTT   GBS Neg    Contraception: considering Depo Infant feeding:  Tdap:  Flu:

## 2021-11-09 ENCOUNTER — Ambulatory Visit
Admission: RE | Admit: 2021-11-09 | Discharge: 2021-11-09 | Disposition: A | Discharge status: Home / Self Care | Payer: Medicaid Other | Source: Ambulatory Visit | Attending: Obstetrics | Admitting: Obstetrics

## 2021-11-09 DIAGNOSIS — D509 Iron deficiency anemia, unspecified: Secondary | ICD-10-CM

## 2021-11-09 MED ORDER — SODIUM CHLORIDE 0.9 % IV SOLN
300.0000 mg | INTRAVENOUS | Status: DC
  Filled 2021-11-09: qty 15

## 2021-11-09 NOTE — Progress Notes (Signed)
No call/no show. RN called patient and got Iron infusion appt re-scheduled for 11/10/2021. Patient verbalized understanding.

## 2021-11-10 ENCOUNTER — Ambulatory Visit
Admission: RE | Admit: 2021-11-10 | Discharge: 2021-11-10 | Disposition: A | Discharge status: Home / Self Care | Payer: Medicaid Other | Source: Ambulatory Visit | Attending: Obstetrics | Admitting: Obstetrics

## 2021-11-10 DIAGNOSIS — O99012 Anemia complicating pregnancy, second trimester: Secondary | ICD-10-CM | POA: Diagnosis present

## 2021-11-10 DIAGNOSIS — D509 Iron deficiency anemia, unspecified: Secondary | ICD-10-CM | POA: Insufficient documentation

## 2021-11-10 MED ORDER — SODIUM CHLORIDE 0.9 % IV SOLN
300.0000 mg | Freq: Once | INTRAVENOUS | Status: AC
  Administered 2021-11-10: 300 mg via INTRAVENOUS

## 2021-11-12 ENCOUNTER — Inpatient Hospital Stay
Admission: EM | Admit: 2021-11-12 | Discharge: 2021-11-16 | DRG: 787 | Disposition: A | Discharge status: Home / Self Care | Payer: Medicaid Other

## 2021-11-12 ENCOUNTER — Other Ambulatory Visit: Payer: Self-pay

## 2021-11-12 ENCOUNTER — Inpatient Hospital Stay: Payer: Medicaid Other | Admitting: Anesthesiology

## 2021-11-12 DIAGNOSIS — O99892 Other specified diseases and conditions complicating childbirth: Secondary | ICD-10-CM | POA: Diagnosis present

## 2021-11-12 DIAGNOSIS — O9952 Diseases of the respiratory system complicating childbirth: Secondary | ICD-10-CM | POA: Diagnosis present

## 2021-11-12 DIAGNOSIS — Z23 Encounter for immunization: Secondary | ICD-10-CM

## 2021-11-12 DIAGNOSIS — O99334 Smoking (tobacco) complicating childbirth: Secondary | ICD-10-CM | POA: Diagnosis present

## 2021-11-12 DIAGNOSIS — O0993 Supervision of high risk pregnancy, unspecified, third trimester: Secondary | ICD-10-CM

## 2021-11-12 DIAGNOSIS — O9081 Anemia of the puerperium: Secondary | ICD-10-CM | POA: Diagnosis not present

## 2021-11-12 DIAGNOSIS — O480 Post-term pregnancy: Secondary | ICD-10-CM | POA: Diagnosis present

## 2021-11-12 DIAGNOSIS — Z3A40 40 weeks gestation of pregnancy: Secondary | ICD-10-CM

## 2021-11-12 DIAGNOSIS — R7303 Prediabetes: Secondary | ICD-10-CM | POA: Diagnosis present

## 2021-11-12 DIAGNOSIS — O99324 Drug use complicating childbirth: Secondary | ICD-10-CM | POA: Diagnosis present

## 2021-11-12 DIAGNOSIS — O99213 Obesity complicating pregnancy, third trimester: Principal | ICD-10-CM | POA: Diagnosis present

## 2021-11-12 DIAGNOSIS — O99214 Obesity complicating childbirth: Secondary | ICD-10-CM | POA: Diagnosis present

## 2021-11-12 DIAGNOSIS — F17210 Nicotine dependence, cigarettes, uncomplicated: Secondary | ICD-10-CM | POA: Diagnosis present

## 2021-11-12 DIAGNOSIS — F1120 Opioid dependence, uncomplicated: Secondary | ICD-10-CM | POA: Diagnosis present

## 2021-11-12 DIAGNOSIS — O09899 Supervision of other high risk pregnancies, unspecified trimester: Secondary | ICD-10-CM

## 2021-11-12 DIAGNOSIS — J4520 Mild intermittent asthma, uncomplicated: Secondary | ICD-10-CM | POA: Diagnosis present

## 2021-11-12 DIAGNOSIS — O99013 Anemia complicating pregnancy, third trimester: Secondary | ICD-10-CM | POA: Diagnosis present

## 2021-11-12 DIAGNOSIS — F17200 Nicotine dependence, unspecified, uncomplicated: Secondary | ICD-10-CM | POA: Diagnosis present

## 2021-11-12 DIAGNOSIS — D62 Acute posthemorrhagic anemia: Secondary | ICD-10-CM | POA: Diagnosis not present

## 2021-11-12 LAB — URINE DRUG SCREEN, QUALITATIVE (ARMC ONLY)
Amphetamines, Ur Screen: NOT DETECTED
Barbiturates, Ur Screen: NOT DETECTED
Benzodiazepine, Ur Scrn: NOT DETECTED
Cannabinoid 50 Ng, Ur ~~LOC~~: NOT DETECTED
Cocaine Metabolite,Ur ~~LOC~~: NOT DETECTED
MDMA (Ecstasy)Ur Screen: NOT DETECTED
Methadone Scn, Ur: NOT DETECTED
Opiate, Ur Screen: NOT DETECTED
Phencyclidine (PCP) Ur S: NOT DETECTED
Tricyclic, Ur Screen: NOT DETECTED

## 2021-11-12 LAB — COMPREHENSIVE METABOLIC PANEL
ALT: 20 U/L (ref 0–44)
AST: 17 U/L (ref 15–41)
Albumin: 2.9 g/dL — ABNORMAL LOW (ref 3.5–5.0)
Alkaline Phosphatase: 120 U/L (ref 38–126)
Anion gap: 5 (ref 5–15)
BUN: 10 mg/dL (ref 6–20)
CO2: 22 mmol/L (ref 22–32)
Calcium: 8.4 mg/dL — ABNORMAL LOW (ref 8.9–10.3)
Chloride: 108 mmol/L (ref 98–111)
Creatinine, Ser: 0.55 mg/dL (ref 0.44–1.00)
GFR, Estimated: >60 mL/min (ref >60)
Glucose, Bld: 128 mg/dL — ABNORMAL HIGH (ref 70–99)
Potassium: 3.3 mmol/L — ABNORMAL LOW (ref 3.5–5.1)
Sodium: 135 mmol/L (ref 135–145)
Total Bilirubin: 0.4 mg/dL (ref 0.3–1.2)
Total Protein: 6.2 g/dL — ABNORMAL LOW (ref 6.5–8.1)

## 2021-11-12 LAB — RPR: RPR Ser Ql: NONREACTIVE

## 2021-11-12 LAB — CBC
HCT: 29.7 % — ABNORMAL LOW (ref 36.0–46.0)
Hemoglobin: 10.1 g/dL — ABNORMAL LOW (ref 12.0–15.0)
MCH: 29.1 pg (ref 26.0–34.0)
MCHC: 34 g/dL (ref 30.0–36.0)
MCV: 85.6 fL (ref 80.0–100.0)
Platelets: 195 10*3/uL (ref 150–400)
RBC: 3.47 MIL/uL — ABNORMAL LOW (ref 3.87–5.11)
RDW: 12.9 % (ref 11.5–15.5)
WBC: 11 10*3/uL — ABNORMAL HIGH (ref 4.0–10.5)
nRBC: 0 % (ref 0.0–0.2)

## 2021-11-12 LAB — PROTEIN / CREATININE RATIO, URINE
Creatinine, Urine: 183 mg/dL
Protein Creatinine Ratio: 0.1 mg/mg{Cre} (ref 0.00–0.15)
Total Protein, Urine: 19 mg/dL

## 2021-11-12 MED ORDER — OXYTOCIN-SODIUM CHLORIDE 30-0.9 UT/500ML-% IV SOLN
2.5000 [IU]/h | INTRAVENOUS | Status: DC
  Filled 2021-11-12: qty 500

## 2021-11-12 MED ORDER — LIDOCAINE HCL (PF) 1 % IJ SOLN: 30.0000 mL | INTRAMUSCULAR | Status: DC | PRN

## 2021-11-12 MED ORDER — ACETAMINOPHEN 325 MG PO TABS: 650.0000 mg | ORAL_TABLET | ORAL | Status: DC | PRN

## 2021-11-12 MED ORDER — AMMONIA AROMATIC IN INHA
RESPIRATORY_TRACT | Status: AC
  Filled 2021-11-12: qty 10

## 2021-11-12 MED ORDER — MISOPROSTOL 25 MCG QUARTER TABLET
25.0000 ug | ORAL_TABLET | Freq: Once | ORAL | Status: AC
  Administered 2021-11-12: 25 ug via VAGINAL
  Filled 2021-11-12: qty 1

## 2021-11-12 MED ORDER — LIDOCAINE HCL (PF) 1 % IJ SOLN
INTRAMUSCULAR | Status: DC | PRN
  Administered 2021-11-12: 1 mL via SUBCUTANEOUS

## 2021-11-12 MED ORDER — ONDANSETRON HCL 4 MG/2ML IJ SOLN
4.0000 mg | Freq: Four times a day (QID) | INTRAMUSCULAR | Status: DC | PRN
  Administered 2021-11-13: 4 mg via INTRAVENOUS
  Filled 2021-11-12: qty 2

## 2021-11-12 MED ORDER — TERBUTALINE SULFATE 1 MG/ML IJ SOLN: 0.2500 mg | Freq: Once | INTRAMUSCULAR | Status: DC | PRN

## 2021-11-12 MED ORDER — OXYTOCIN-SODIUM CHLORIDE 30-0.9 UT/500ML-% IV SOLN
1.0000 m[IU]/min | INTRAVENOUS | Status: DC
  Administered 2021-11-12: 2 m[IU]/min via INTRAVENOUS
  Filled 2021-11-12: qty 500

## 2021-11-12 MED ORDER — LACTATED RINGERS IV SOLN: 500.0000 mL | Freq: Once | INTRAVENOUS | Status: DC

## 2021-11-12 MED ORDER — PHENYLEPHRINE 80 MCG/ML (10ML) SYRINGE FOR IV PUSH (FOR BLOOD PRESSURE SUPPORT): 80.0000 ug | PREFILLED_SYRINGE | INTRAVENOUS | Status: DC | PRN

## 2021-11-12 MED ORDER — MISOPROSTOL 200 MCG PO TABS
ORAL_TABLET | ORAL | Status: AC
  Filled 2021-11-12: qty 4

## 2021-11-12 MED ORDER — MISOPROSTOL 50MCG HALF TABLET
50.0000 ug | ORAL_TABLET | ORAL | Status: DC | PRN
  Administered 2021-11-12 (×2): 50 ug via ORAL
  Filled 2021-11-12 (×2): qty 1

## 2021-11-12 MED ORDER — OXYTOCIN 10 UNIT/ML IJ SOLN
INTRAMUSCULAR | Status: AC
  Filled 2021-11-12: qty 2

## 2021-11-12 MED ORDER — LACTATED RINGERS IV SOLN: INTRAVENOUS | Status: DC

## 2021-11-12 MED ORDER — FENTANYL CITRATE (PF) 100 MCG/2ML IJ SOLN
50.0000 ug | INTRAMUSCULAR | Status: DC | PRN
  Administered 2021-11-13: 50 ug via INTRAVENOUS

## 2021-11-12 MED ORDER — DIPHENHYDRAMINE HCL 50 MG/ML IJ SOLN: 12.5000 mg | INTRAMUSCULAR | Status: DC | PRN

## 2021-11-12 MED ORDER — CALCIUM CARBONATE ANTACID 500 MG PO CHEW
1.0000 | CHEWABLE_TABLET | Freq: Three times a day (TID) | ORAL | Status: DC | PRN
  Administered 2021-11-12: 200 mg via ORAL
  Filled 2021-11-12: qty 1

## 2021-11-12 MED ORDER — LACTATED RINGERS IV SOLN: 500.0000 mL | INTRAVENOUS | Status: DC | PRN

## 2021-11-12 MED ORDER — BUPRENORPHINE HCL 8 MG SL SUBL
8.0000 mg | SUBLINGUAL_TABLET | Freq: Two times a day (BID) | SUBLINGUAL | Status: DC
  Administered 2021-11-12 – 2021-11-16 (×9): 8 mg via SUBLINGUAL
  Filled 2021-11-12 (×11): qty 1

## 2021-11-12 MED ORDER — SOD CITRATE-CITRIC ACID 500-334 MG/5ML PO SOLN
30.0000 mL | ORAL | Status: DC | PRN
  Administered 2021-11-12: 30 mL via ORAL

## 2021-11-12 MED ORDER — LIDOCAINE-EPINEPHRINE (PF) 1.5 %-1:200000 IJ SOLN
INTRAMUSCULAR | Status: DC | PRN
  Administered 2021-11-12: 3 mL via EPIDURAL

## 2021-11-12 MED ORDER — SODIUM CHLORIDE 0.9 % IV SOLN
INTRAVENOUS | Status: DC | PRN
  Administered 2021-11-12 (×2): 5 mL via EPIDURAL

## 2021-11-12 MED ORDER — OXYTOCIN BOLUS FROM INFUSION: 333.0000 mL | Freq: Once | INTRAVENOUS | Status: DC

## 2021-11-12 MED ORDER — FENTANYL-BUPIVACAINE-NACL 0.5-0.125-0.9 MG/250ML-% EP SOLN
12.0000 mL/h | EPIDURAL | Status: DC | PRN
  Filled 2021-11-12: qty 250

## 2021-11-12 MED ORDER — EPHEDRINE 5 MG/ML INJ: 10.0000 mg | INTRAVENOUS | Status: DC | PRN

## 2021-11-12 MED ORDER — LIDOCAINE HCL (PF) 1 % IJ SOLN
INTRAMUSCULAR | Status: AC
  Filled 2021-11-12: qty 30

## 2021-11-12 MED ORDER — MISOPROSTOL 25 MCG QUARTER TABLET
25.0000 ug | ORAL_TABLET | Freq: Once | ORAL | Status: AC
  Administered 2021-11-12: 25 ug via ORAL
  Filled 2021-11-12: qty 1

## 2021-11-12 MED ORDER — PANTOPRAZOLE SODIUM 40 MG PO TBEC
40.0000 mg | DELAYED_RELEASE_TABLET | Freq: Every day | ORAL | Status: DC
  Administered 2021-11-12 – 2021-11-16 (×5): 40 mg via ORAL
  Filled 2021-11-12 (×5): qty 1

## 2021-11-12 NOTE — Anesthesia Preprocedure Evaluation (Addendum)
Anesthesia Evaluation  Patient identified by MRN, date of birth, ID band Patient awake    Reviewed: Allergy & Precautions, NPO status , Patient's Chart, lab work & pertinent test results  History of Anesthesia Complications Negative for: history of anesthetic complications  Airway Mallampati: III  TM Distance: >3 FB Neck ROM: full    Dental  (+) Chipped   Pulmonary asthma , Current Smoker   Pulmonary exam normal        Cardiovascular Exercise Tolerance: Good (-) hypertensionnegative cardio ROS Normal cardiovascular exam     Neuro/Psych    GI/Hepatic negative GI ROS,,,  Endo/Other    Renal/GU   negative genitourinary   Musculoskeletal   Abdominal   Peds  Hematology negative hematology ROS (+)   Anesthesia Other Findings Past Medical History: No date: Kyphosis deformity of spine  Past Surgical History: No date: TONSILLECTOMY  BMI    Body Mass Index: 38.01 kg/m      Reproductive/Obstetrics (+) Pregnancy                             Anesthesia Physical Anesthesia Plan  ASA: 3 and emergent  Anesthesia Plan: Epidural   Post-op Pain Management:    Induction:   PONV Risk Score and Plan:   Airway Management Planned: Natural Airway  Additional Equipment:   Intra-op Plan:   Post-operative Plan:   Informed Consent: I have reviewed the patients History and Physical, chart, labs and discussed the procedure including the risks, benefits and alternatives for the proposed anesthesia with the patient or authorized representative who has indicated his/her understanding and acceptance.     Dental Advisory Given  Plan Discussed with: Anesthesiologist  Anesthesia Plan Comments: (Patient reports no bleeding problems and no anticoagulant use.   Patient consented for risks of anesthesia including but not limited to:  - adverse reactions to medications - risk of bleeding,  infection and or nerve damage from epidural that could lead to paralysis - risk of headache or failed epidural - nerve damage due to positioning - that if epidural is used for C-section that there is a chance of epidural failure requiring spinal placement or conversion to GA - Damage to heart, brain, lungs, other parts of body or loss of life  Patient voiced understanding.)       Anesthesia Quick Evaluation

## 2021-11-12 NOTE — Plan of Care (Signed)
  Problem: Education: Goal: Ability to make informed decisions regarding treatment and plan of care will improve Outcome: Progressing   Problem: Safety: Goal: Risk of complications during labor and delivery will decrease Outcome: Progressing   Problem: Pain Management: Goal: Relief or control of pain from uterine contractions will improve Outcome: Progressing

## 2021-11-12 NOTE — H&P (Signed)
OB History & Physical   History of Present Illness:   Chief Complaint: scheduled IOL  HPI:  Katie Watson is a 31 y.o. G1P0 female at [redacted]w[redacted]d, No LMP recorded. Patient is pregnant., not consistent with [place] at [redacted]w[redacted]d, with Estimated Date of Delivery: 11/11/21.  She presents to L&D for scheduled IOL for obesity in pregnancy and suboxone maintenance in pregnancy.  She denies significant contractions, LOF, or vaginal bleeding. Endorses good fetal movement.  Katie Watson reports she currently takes Suboxone 8mg  BID.  Last dose was last night at 1900.  Reports active fetal movement  Contractions: denies  LOF/SROM: denies Vaginal bleeding: denies   Factors complicating pregnancy:  Hx substance abuse, suboxone 8mg  daily Iron deficiency anemia Obesity BMI 40 Echogenic focus on anatomy US, normal materniT21 results.  Prediabetes, A1C 5.9->5.6 Tobacco use 1/2ppd ASthma Rubella NON-immune  Patient Active Problem List   Diagnosis Date Noted   Obesity affecting pregnancy in third trimester [id number]   Obesity complicating pregnancy in third trimester 11/06/2021   Suboxone maintenance treatment complicating pregnancy, antepartum ([place]) 09/03/2021   Echogenic intracardiac focus of fetus on prenatal ultrasound 09/03/2021   Anemia affecting pregnancy in third trimester 08/21/2021   Rubella non-immune status, antepartum 08/21/2021   Supervision of high risk pregnancy in third trimester 03/19/2021   Reactive airway disease, mild intermittent, uncomplicated 99991111   Opioid use disorder, severe, on maintenance therapy ([place]) 01/01/2020   Scoliosis 02/08/2011   Tobacco use disorder 02/08/2011    Prenatal Transfer Tool  Maternal Diabetes: No Genetic Screening: Normal Maternal Ultrasounds/Referrals: Isolated EIF (echogenic intracardiac focus) Fetal Ultrasounds or other Referrals:  None Maternal Substance Abuse:  Yes:  Type: Other: Opioid use disorder, suboxone maintenance in pregnancy  Significant  Maternal Medications:  Meds include: Other:  Suboxone Significant Maternal Lab Results: Group B Strep negative  Maternal Medical History:   Past Medical History:  Diagnosis Date   Kyphosis deformity of spine     Past Surgical History:  Procedure Laterality Date   TONSILLECTOMY      Allergies  Allergen Reactions   Pertussis Immune Globulin Anaphylaxis    Prior to Admission medications   Medication Sig Start Date End Date Taking? Authorizing Provider  buprenorphine-naloxone (SUBOXONE) 8-2 mg SUBL SL tablet Place 1 tablet under the tongue 2 (two) times daily. 09/27/19  Yes [provider]  Aspirin-Acetaminophen-Caffeine (GOODY HEADACHE PO) Take 1 packet by mouth daily as needed (pain).    [provider]  clindamycin (CLEOCIN) 150 MG capsule Take 1 capsule (150 mg total) by mouth every 6 (six) hours. Patient not taking: Reported on 06/25/2021 10/10/19   Margarette Canada, NP  etonogestrel (NEXPLANON) 68 MG IMPL implant Inject 1 each into the skin as directed.  10/10/19  [provider]     Prenatal care site:  [facility]  OB History  Gravida Para Term Preterm AB Living  1 0 0 0 0 0  SAB IAB Ectopic Multiple Live Births  0 0 0 0 0    # Outcome Date GA Lbr Len/2nd Weight Sex Delivery Anes PTL Lv  1 Current              Social History: She  reports that she has been smoking cigarettes. She has been smoking an average of .5 packs per day. She has never used smokeless tobacco. She reports that she does not currently use drugs. She reports that she does not drink alcohol.  Family History: family history includes Healthy in her father and  mother.   Review of Systems: A full review of systems was performed and negative except as noted in the HPI.     Physical Exam:  Vital Signs: BP 121/62 (BP Location: Left Arm)   Pulse 93   Temp 98.4 F (36.9 C) (Oral)   Resp 18   Ht 5\' 8"  (1.727 m)   Wt 113.4 kg   SpO2 95%   BMI 38.01 kg/m   General:  no acute distress.  HEENT: normocephalic, atraumatic Heart: regular rate & rhythm Lungs: normal respiratory effort Abdomen: soft, gravid, non-tender;  EFW: 7 1/2 lbs  Pelvic:   External: Normal external female genitalia  Cervix: Dilation: Fingertip / Effacement (%): Thick / Station: -3    Extremities: non-tender, symmetric, No edema bilaterally.  DTRs: 2+/2+  Neurologic: Alert & oriented x 3.    Results for orders placed or performed during the hospital encounter of 11/12/21 (from the past 24 hour(s))  Protein / creatinine ratio, urine     Status: None   Collection Time: 11/12/21 12:40 AM  Result Value Ref Range   Creatinine, Urine 183 mg/dL   Total Protein, Urine 19 mg/dL   Protein Creatinine Ratio 0.10 0.00 - 0.15 mg/mg[Cre]  Urine Drug Screen, Qualitative ([facility] only)     Status: None   Collection Time: 11/12/21 12:40 AM  Result Value Ref Range   Tricyclic, Ur Screen NONE DETECTED NONE DETECTED   Amphetamines, Ur Screen NONE DETECTED NONE DETECTED   MDMA (Ecstasy)Ur Screen NONE DETECTED NONE DETECTED   Cocaine Metabolite,Ur [place] NONE DETECTED NONE DETECTED   Opiate, Ur Screen NONE DETECTED NONE DETECTED   Phencyclidine (PCP) Ur S NONE DETECTED NONE DETECTED   Cannabinoid 50 Ng, Ur [place] NONE DETECTED NONE DETECTED   Barbiturates, Ur Screen NONE DETECTED NONE DETECTED   Benzodiazepine, Ur Scrn NONE DETECTED NONE DETECTED   Methadone Scn, Ur NONE DETECTED NONE DETECTED  CBC     Status: Abnormal   Collection Time: 11/12/21  1:30 AM  Result Value Ref Range   WBC 11.0 (H) 4.0 - 10.5 K/uL   RBC 3.47 (L) 3.87 - 5.11 MIL/uL   Hemoglobin 10.1 (L) 12.0 - 15.0 g/dL   HCT 29.7 (L) 36.0 - 46.0 %   MCV 85.6 80.0 - 100.0 fL   MCH 29.1 26.0 - 34.0 pg   MCHC 34.0 30.0 - 36.0 g/dL   RDW 12.9 11.5 - 15.5 %   Platelets 195 150 - 400 K/uL   nRBC 0.0 0.0 - 0.2 %  Comprehensive metabolic panel     Status: Abnormal   Collection Time: 11/12/21  1:30 AM  Result Value Ref Range   Sodium 135 135 -  145 mmol/L   Potassium 3.3 (L) 3.5 - 5.1 mmol/L   Chloride 108 98 - 111 mmol/L   CO2 22 22 - 32 mmol/L   Glucose, Bld 128 (H) 70 - 99 mg/dL   BUN 10 6 - 20 mg/dL   Creatinine, Ser 0.55 0.44 - 1.00 mg/dL   Calcium 8.4 (L) 8.9 - 10.3 mg/dL   Total Protein 6.2 (L) 6.5 - 8.1 g/dL   Albumin 2.9 (L) 3.5 - 5.0 g/dL   AST 17 15 - 41 U/L   ALT 20 0 - 44 U/L   Alkaline Phosphatase 120 38 - 126 U/L   Total Bilirubin 0.4 0.3 - 1.2 mg/dL   GFR, Estimated >60 >60 mL/min   Anion gap 5 5 - 15  Type and screen  Status: None   Collection Time: 11/12/21  1:36 AM  Result Value Ref Range   ABO/RH(D) O POS    Antibody Screen NEG    Sample Expiration      11/15/2021,2359 Performed at [facility], [address] [place], [place] [address]     Pertinent Results:  Prenatal Labs: Blood type/Rh O pos  Antibody screen neg  Rubella NON-Immune  Varicella Immune  RPR NR  HBsAg Neg  HIV NR  GC neg  Chlamydia neg  Genetic screening negative  1 hour GTT 92  3 hour GTT    GBS Neg   FHT:  FHR: 120 bpm, variability: moderate,  accelerations:  Present,  decelerations:  Absent Category/reactivity:  Category I UC:   irregular, mild contractions since misoprostol given   Cephalic by [place] and SVE   No results found.  Assessment:  ASHUNTI BOENDER is a 31 y.o. G1P0 female at [redacted]w[redacted]d with scheduled IOL for obesity and suboxone maintenance in pregnancy .   Plan:  1. Admit to Labor & Delivery - consents reviewed and obtained - Dr. Glennon Mac notified of admission and plan of care  - Subutex 8mg  BID ordered   2. Fetal Well being  - Fetal Tracing: cat 1 - Group B Streptococcus ppx not indicated: GBS negative - Presentation: cephalic confirmed by SVE   3. Routine OB: - Prenatal labs reviewed, as above - Rh positive - CBC, T&S, RPR on admit - Regular diet, continuous IV fluids  4. Induction of labor  - Contractions monitored with external toco - Pelvis adequate for trial of  labor  - Plan for induction with misoprostol  - Induction with oxytocin, AROM, and cervical balloon as appropriate  - Plan for  continuous fetal monitoring - Maternal pain control as desired - Anticipate vaginal delivery  5. Post Partum Planning: - Infant feeding:  TBD - Contraception: Depo-Provera - Tdap vaccine:  declined  - Flu vaccine:  declined   Minda Meo, CNM 11/12/21 7:33 AM  Drinda Butts, CNM Certified Nurse Midwife [facility]

## 2021-11-12 NOTE — Anesthesia Procedure Notes (Signed)
Epidural Patient location during procedure: OB Start time: 11/12/2021 8:51 PM End time: 11/12/2021 8:56 PM  Staffing Anesthesiologist: Regan Mcbryar, Precious Haws, MD Performed: anesthesiologist   Preanesthetic Checklist Completed: patient identified, IV checked, site marked, risks and benefits discussed, surgical consent, monitors and equipment checked, pre-op evaluation and timeout performed  Epidural Patient position: sitting Prep: ChloraPrep Patient monitoring: heart rate, continuous pulse ox and blood pressure Approach: midline Location: L3-L4 Injection technique: LOR saline  Needle:  Needle type: Tuohy  Needle gauge: 17 G Needle length: 9 cm and 9 Needle insertion depth: 9 cm Catheter type: closed end flexible Catheter size: 19 Gauge Catheter at skin depth: 15 cm Test dose: negative and 1.5% lidocaine with Epi 1:200 K  Assessment Sensory level: T10 Events: blood not aspirated, injection not painful, no injection resistance, no paresthesia and negative IV test  Additional Notes 1 attempt Pt. Evaluated and documentation done after procedure finished. Patient identified. Risks/Benefits/Options discussed with patient including but not limited to bleeding, infection, nerve damage, paralysis, failed block, incomplete pain control, headache, blood pressure changes, nausea, vomiting, reactions to medication both or allergic, itching and postpartum back pain. Confirmed with bedside nurse the patient's most recent platelet count. Confirmed with patient that they are not currently taking any anticoagulation, have any bleeding history or any family history of bleeding disorders. Patient expressed understanding and wished to proceed. All questions were answered. Sterile technique was used throughout the entire procedure. Please see nursing notes for vital signs. Test dose was given through epidural catheter and negative prior to continuing to dose epidural or start infusion. Warning signs of  high block given to the patient including shortness of breath, tingling/numbness in hands, complete motor block, or any concerning symptoms with instructions to call for help. Patient was given instructions on fall risk and not to get out of bed. All questions and concerns addressed with instructions to call with any issues or inadequate analgesia.    Patient tolerated the insertion well without immediate complications.Reason for block:procedure for pain

## 2021-11-12 NOTE — Progress Notes (Signed)
Labor Progress Note  Katie Watson is a 31 y.o. G1P0 at [redacted]w[redacted]d by LMP admitted for induction of labor due to Obesity, substance use d/o.  Subjective: feels well, occasional cramping noted.   Objective: BP 121/62 (BP Location: Left Arm)   Pulse 93   Temp 98.4 F (36.9 C) (Oral)   Resp 18   Ht 5\' 8"  (1.727 m)   Wt 113.4 kg   LMP 12/20/2020   SpO2 95%   BMI 38.01 kg/m  Notable VS details: reviewed.   Vitals:   11/12/21 0124 11/12/21 0532 11/12/21 0710  BP: 117/77 120/75 121/62     Fetal Assessment: FHT:  FHR: 130 bpm, variability: moderate,  accelerations:  Present,  decelerations:  Absent Category/reactivity:  Category I UC:   regular, every 2-5 minutes, mild with UI SVE:   2/50/-2, posterior, soft.  Lacinda Axon cath placed, uterine and vaginal balloons inflated with 17ml each sterile water.   Membrane status: intact Amniotic color: n/a  Labs: Lab Results  Component Value Date   WBC 11.0 (H) 11/12/2021   HGB 10.1 (L) 11/12/2021   HCT 29.7 (L) 11/12/2021   MCV 85.6 11/12/2021   PLT 195 11/12/2021    Assessment / Plan: G1P0 at [redacted]w[redacted]d with IOL due to morbid obesity and substance use d/o  Labor: s/p 3 doses of cytotec, Cook cath placed. Will start Pitocin 4hrs after last cytotec dose.  Preeclampsia:  no e/o pre-E, normal baseline labs on admit.  Fetal Wellbeing:  Category I Pain Control:  Labor support without medications I/D:   GBS Neg Anticipated MOD:  NSVD  Murray Hodgkins Myra Weng, CNM 11/12/2021, 10:33 AM       ;

## 2021-11-12 NOTE — Progress Notes (Signed)
Labor Progress Note  Katie Watson is a 31 y.o. G1P0 at [redacted]w[redacted]d by LMP admitted for induction of labor due to Obesity, substance use d/o.  Subjective: feels comfortable after epidural.   Objective: BP (!) 149/74   Pulse 89   Temp 98.6 F (37 C) (Oral)   Resp 16   Ht 5\' 8"  (1.727 m)   Wt 113.4 kg   LMP 12/20/2020   SpO2 97%   BMI 38.01 kg/m  Notable VS details: reviewed.     Vitals:   11/12/21 0124 11/12/21 0532 11/12/21 0710 11/12/21 1215  BP: 117/77 120/75 121/62 134/84   11/12/21 1630 11/12/21 2124  BP: 135/89 (!) 149/74     Fetal Assessment: FHT:  FHR: 125 bpm, variability: moderate,  accelerations:  Present,  decelerations:  Present early Category/reactivity:  Category I UC:   regular, q2-80min, Pitocin at 29mu, MVUs inadequate 150-180.  SVE:  6/80/-2, some molding noted.   Membrane status: AROM at 1817 Amniotic color: clear fluid  Labs: Lab Results  Component Value Date   WBC 11.0 (H) 11/12/2021   HGB 10.1 (L) 11/12/2021   HCT 29.7 (L) 11/12/2021   MCV 85.6 11/12/2021   PLT 195 11/12/2021    Assessment / Plan: G1P0 at [redacted]w[redacted]d with IOL due to morbid obesity and substance use d/o  Labor: s/p 3 doses of cytotec, s/p Cook cath, and titrating pitocin. MVUs inadequate, will monitor closely.  Preeclampsia:  no e/o pre-E, normal baseline labs on admit.  Fetal Wellbeing:  Category I Pain Control:  Epidural I/D:   GBS Neg Anticipated MOD:  NSVD  Murray Hodgkins Raquel Racey, CNM 11/12/2021, 10:30 PM       ;

## 2021-11-13 ENCOUNTER — Other Ambulatory Visit: Payer: Self-pay

## 2021-11-13 ENCOUNTER — Encounter: Admission: EM | Disposition: A | Discharge status: Home / Self Care | Payer: Self-pay | Source: Home / Self Care

## 2021-11-13 LAB — CBC
HCT: 23.8 % — ABNORMAL LOW (ref 36.0–46.0)
Hemoglobin: 8.3 g/dL — ABNORMAL LOW (ref 12.0–15.0)
MCH: 30.1 pg (ref 26.0–34.0)
MCHC: 34.9 g/dL (ref 30.0–36.0)
MCV: 86.2 fL (ref 80.0–100.0)
Platelets: 185 10*3/uL (ref 150–400)
RBC: 2.76 MIL/uL — ABNORMAL LOW (ref 3.87–5.11)
RDW: 12.8 % (ref 11.5–15.5)
WBC: 18.4 10*3/uL — ABNORMAL HIGH (ref 4.0–10.5)
nRBC: 0 % (ref 0.0–0.2)

## 2021-11-13 SURGERY — Surgical Case
Anesthesia: Epidural

## 2021-11-13 MED ORDER — OXYTOCIN-SODIUM CHLORIDE 30-0.9 UT/500ML-% IV SOLN
INTRAVENOUS | Status: AC
  Administered 2021-11-13: 2.5 [IU]/h via INTRAVENOUS
  Filled 2021-11-13: qty 500

## 2021-11-13 MED ORDER — IBUPROFEN 600 MG PO TABS
600.0000 mg | ORAL_TABLET | Freq: Four times a day (QID) | ORAL | Status: DC
  Administered 2021-11-14 – 2021-11-16 (×10): 600 mg via ORAL
  Filled 2021-11-13 (×10): qty 1

## 2021-11-13 MED ORDER — DIBUCAINE (PERIANAL) 1 % EX OINT: 1.0000 | TOPICAL_OINTMENT | CUTANEOUS | Status: DC | PRN

## 2021-11-13 MED ORDER — PROPOFOL 10 MG/ML IV BOLUS
INTRAVENOUS | Status: AC
  Filled 2021-11-13: qty 20

## 2021-11-13 MED ORDER — ENOXAPARIN SODIUM 40 MG/0.4ML IJ SOSY
40.0000 mg | PREFILLED_SYRINGE | INTRAMUSCULAR | Status: DC
  Administered 2021-11-14 – 2021-11-16 (×3): 40 mg via SUBCUTANEOUS
  Filled 2021-11-13 (×4): qty 0.4

## 2021-11-13 MED ORDER — FENTANYL CITRATE (PF) 100 MCG/2ML IJ SOLN
INTRAMUSCULAR | Status: DC | PRN
  Administered 2021-11-13: 100 ug via EPIDURAL

## 2021-11-13 MED ORDER — WITCH HAZEL-GLYCERIN EX PADS: 1.0000 | MEDICATED_PAD | CUTANEOUS | Status: DC | PRN

## 2021-11-13 MED ORDER — SUCCINYLCHOLINE CHLORIDE 200 MG/10ML IV SOSY
PREFILLED_SYRINGE | INTRAVENOUS | Status: AC
  Filled 2021-11-13: qty 10

## 2021-11-13 MED ORDER — ACETAMINOPHEN 325 MG PO TABS: 650.0000 mg | ORAL_TABLET | Freq: Four times a day (QID) | ORAL | Status: DC

## 2021-11-13 MED ORDER — CEFAZOLIN SODIUM-DEXTROSE 2-4 GM/100ML-% IV SOLN
INTRAVENOUS | Status: AC
  Filled 2021-11-13: qty 100

## 2021-11-13 MED ORDER — PHENYLEPHRINE HCL (PRESSORS) 10 MG/ML IV SOLN
INTRAVENOUS | Status: AC
  Filled 2021-11-13: qty 1

## 2021-11-13 MED ORDER — DIPHENHYDRAMINE HCL 25 MG PO CAPS: 25.0000 mg | ORAL_CAPSULE | ORAL | Status: DC | PRN

## 2021-11-13 MED ORDER — SOD CITRATE-CITRIC ACID 500-334 MG/5ML PO SOLN
30.0000 mL | ORAL | Status: AC
  Administered 2021-11-13: 30 mL via ORAL

## 2021-11-13 MED ORDER — OXYCODONE HCL 5 MG/5ML PO SOLN
5.0000 mg | Freq: Once | ORAL | Status: DC | PRN
  Filled 2021-11-13: qty 5

## 2021-11-13 MED ORDER — MORPHINE SULFATE (PF) 2 MG/ML IV SOLN: 1.0000 mg | INTRAVENOUS | Status: DC | PRN

## 2021-11-13 MED ORDER — NALOXONE HCL 4 MG/10ML IJ SOLN: 1.0000 ug/kg/h | INTRAVENOUS | Status: DC | PRN

## 2021-11-13 MED ORDER — DEXAMETHASONE SODIUM PHOSPHATE 10 MG/ML IJ SOLN
INTRAMUSCULAR | Status: AC
  Filled 2021-11-13: qty 1

## 2021-11-13 MED ORDER — SOD CITRATE-CITRIC ACID 500-334 MG/5ML PO SOLN
ORAL | Status: AC
  Filled 2021-11-13: qty 15

## 2021-11-13 MED ORDER — METHYLERGONOVINE MALEATE 0.2 MG/ML IJ SOLN
INTRAMUSCULAR | Status: AC
  Filled 2021-11-13: qty 1

## 2021-11-13 MED ORDER — OXYCODONE HCL 5 MG PO TABS: 5.0000 mg | ORAL_TABLET | Freq: Once | ORAL | Status: DC | PRN

## 2021-11-13 MED ORDER — KETOROLAC TROMETHAMINE 30 MG/ML IJ SOLN
INTRAMUSCULAR | Status: DC | PRN
  Administered 2021-11-13: 30 mg via INTRAVENOUS

## 2021-11-13 MED ORDER — SIMETHICONE 80 MG PO CHEW
80.0000 mg | CHEWABLE_TABLET | Freq: Three times a day (TID) | ORAL | Status: DC
  Administered 2021-11-13 – 2021-11-16 (×10): 80 mg via ORAL
  Filled 2021-11-13 (×10): qty 1

## 2021-11-13 MED ORDER — PROPOFOL 10 MG/ML IV BOLUS
INTRAVENOUS | Status: DC | PRN
  Administered 2021-11-13: 200 mg via INTRAVENOUS

## 2021-11-13 MED ORDER — PRENATAL MULTIVITAMIN CH
1.0000 | ORAL_TABLET | Freq: Every day | ORAL | Status: DC
  Administered 2021-11-13 – 2021-11-16 (×4): 1 via ORAL
  Filled 2021-11-13 (×4): qty 1

## 2021-11-13 MED ORDER — SODIUM CHLORIDE 0.9 % IV SOLN
300.0000 mg | Freq: Once | INTRAVENOUS | Status: AC
  Administered 2021-11-13: 300 mg via INTRAVENOUS
  Filled 2021-11-13: qty 300

## 2021-11-13 MED ORDER — LIDOCAINE HCL (PF) 2 % IJ SOLN
INTRAMUSCULAR | Status: AC
  Filled 2021-11-13: qty 5

## 2021-11-13 MED ORDER — ACETAMINOPHEN 500 MG PO TABS
1000.0000 mg | ORAL_TABLET | Freq: Four times a day (QID) | ORAL | Status: DC
  Administered 2021-11-13 – 2021-11-16 (×13): 1000 mg via ORAL
  Filled 2021-11-13 (×15): qty 2

## 2021-11-13 MED ORDER — FENTANYL CITRATE (PF) 100 MCG/2ML IJ SOLN
INTRAMUSCULAR | Status: AC
  Filled 2021-11-13: qty 2

## 2021-11-13 MED ORDER — FENTANYL CITRATE (PF) 100 MCG/2ML IJ SOLN: 25.0000 ug | INTRAMUSCULAR | Status: DC | PRN

## 2021-11-13 MED ORDER — ZOLPIDEM TARTRATE 5 MG PO TABS: 5.0000 mg | ORAL_TABLET | Freq: Every evening | ORAL | Status: DC | PRN

## 2021-11-13 MED ORDER — CEFAZOLIN SODIUM-DEXTROSE 2-4 GM/100ML-% IV SOLN: 2.0000 g | INTRAVENOUS | Status: DC

## 2021-11-13 MED ORDER — LIDOCAINE HCL 2 % IJ SOLN
INTRAMUSCULAR | Status: AC
  Filled 2021-11-13: qty 20

## 2021-11-13 MED ORDER — OXYTOCIN-SODIUM CHLORIDE 30-0.9 UT/500ML-% IV SOLN: 2.5000 [IU]/h | INTRAVENOUS | Status: AC

## 2021-11-13 MED ORDER — SENNOSIDES-DOCUSATE SODIUM 8.6-50 MG PO TABS
2.0000 | ORAL_TABLET | Freq: Every day | ORAL | Status: DC
  Administered 2021-11-14 – 2021-11-16 (×3): 2 via ORAL
  Filled 2021-11-13 (×3): qty 2

## 2021-11-13 MED ORDER — SIMETHICONE 80 MG PO CHEW: 80.0000 mg | CHEWABLE_TABLET | ORAL | Status: DC | PRN

## 2021-11-13 MED ORDER — NALOXONE HCL 0.4 MG/ML IJ SOLN: 0.4000 mg | INTRAMUSCULAR | Status: DC | PRN

## 2021-11-13 MED ORDER — SODIUM CHLORIDE 0.9 % IV SOLN
INTRAVENOUS | Status: AC
  Filled 2021-11-13: qty 5

## 2021-11-13 MED ORDER — KETOROLAC TROMETHAMINE 30 MG/ML IJ SOLN: 30.0000 mg | Freq: Four times a day (QID) | INTRAMUSCULAR | Status: AC

## 2021-11-13 MED ORDER — KETOROLAC TROMETHAMINE 30 MG/ML IJ SOLN: 30.0000 mg | Freq: Four times a day (QID) | INTRAMUSCULAR | Status: DC

## 2021-11-13 MED ORDER — SUCCINYLCHOLINE CHLORIDE 200 MG/10ML IV SOSY
PREFILLED_SYRINGE | INTRAVENOUS | Status: DC | PRN
  Administered 2021-11-13: 200 mg via INTRAVENOUS

## 2021-11-13 MED ORDER — LIDOCAINE HCL (PF) 2 % IJ SOLN
INTRAMUSCULAR | Status: DC | PRN
  Administered 2021-11-13 (×4): 100 mg via EPIDURAL

## 2021-11-13 MED ORDER — SODIUM CHLORIDE 0.9% FLUSH: 20.0000 mL | Freq: Once | INTRAVENOUS | Status: DC

## 2021-11-13 MED ORDER — ONDANSETRON HCL 4 MG/2ML IJ SOLN: 4.0000 mg | Freq: Three times a day (TID) | INTRAMUSCULAR | Status: DC | PRN

## 2021-11-13 MED ORDER — SODIUM CHLORIDE 0.9% FLUSH: 3.0000 mL | INTRAVENOUS | Status: DC | PRN

## 2021-11-13 MED ORDER — OXYCODONE HCL 5 MG PO TABS: 5.0000 mg | ORAL_TABLET | ORAL | Status: AC | PRN

## 2021-11-13 MED ORDER — LACTATED RINGERS AMNIOINFUSION
INTRAVENOUS | Status: DC
  Filled 2021-11-13: qty 1000

## 2021-11-13 MED ORDER — MORPHINE SULFATE (PF) 0.5 MG/ML IJ SOLN
INTRAMUSCULAR | Status: AC
  Filled 2021-11-13: qty 10

## 2021-11-13 MED ORDER — IBUPROFEN 600 MG PO TABS: 600.0000 mg | ORAL_TABLET | Freq: Four times a day (QID) | ORAL | Status: DC

## 2021-11-13 MED ORDER — MORPHINE SULFATE (PF) 0.5 MG/ML IJ SOLN
INTRAMUSCULAR | Status: DC | PRN
  Administered 2021-11-13: 3 mg via EPIDURAL

## 2021-11-13 MED ORDER — MENTHOL 3 MG MT LOZG: 1.0000 | LOZENGE | OROMUCOSAL | Status: DC | PRN

## 2021-11-13 MED ORDER — ONDANSETRON HCL 4 MG/2ML IJ SOLN
INTRAMUSCULAR | Status: AC
  Filled 2021-11-13: qty 2

## 2021-11-13 MED ORDER — METHYLERGONOVINE MALEATE 0.2 MG/ML IJ SOLN
INTRAMUSCULAR | Status: DC | PRN
  Administered 2021-11-13: .2 mg via INTRAMUSCULAR

## 2021-11-13 MED ORDER — CHLORHEXIDINE GLUCONATE 0.12 % MT SOLN
OROMUCOSAL | Status: AC
  Filled 2021-11-13: qty 15

## 2021-11-13 MED ORDER — SODIUM CHLORIDE 0.9 % IV SOLN
500.0000 mg | INTRAVENOUS | Status: AC
  Administered 2021-11-13: 500 mg via INTRAVENOUS

## 2021-11-13 MED ORDER — COCONUT OIL OIL
1.0000 | TOPICAL_OIL | Status: DC | PRN
  Filled 2021-11-13: qty 7.5

## 2021-11-13 MED ORDER — ONDANSETRON HCL 4 MG/2ML IJ SOLN
INTRAMUSCULAR | Status: DC | PRN
  Administered 2021-11-13: 4 mg via INTRAVENOUS

## 2021-11-13 MED ORDER — DIPHENHYDRAMINE HCL 50 MG/ML IJ SOLN: 12.5000 mg | INTRAMUSCULAR | Status: DC | PRN

## 2021-11-13 MED ORDER — TETANUS-DIPHTH-ACELL PERTUSSIS 5-2.5-18.5 LF-MCG/0.5 IM SUSY
0.5000 mL | PREFILLED_SYRINGE | Freq: Once | INTRAMUSCULAR | Status: DC
  Filled 2021-11-13: qty 0.5

## 2021-11-13 MED ORDER — BUPIVACAINE HCL (PF) 0.5 % IJ SOLN
60.0000 mL | Freq: Once | INTRAMUSCULAR | Status: DC
  Filled 2021-11-13: qty 60

## 2021-11-13 MED ORDER — CEFAZOLIN SODIUM-DEXTROSE 1-4 GM/50ML-% IV SOLN
INTRAVENOUS | Status: AC
  Filled 2021-11-13: qty 50

## 2021-11-13 MED ORDER — CEFAZOLIN IN SODIUM CHLORIDE 3-0.9 GM/100ML-% IV SOLN
3.0000 g | INTRAVENOUS | Status: AC
  Administered 2021-11-13: 3 g via INTRAVENOUS
  Filled 2021-11-13: qty 100

## 2021-11-13 MED ORDER — PHENYLEPHRINE HCL-NACL 20-0.9 MG/250ML-% IV SOLN
INTRAVENOUS | Status: DC | PRN
  Administered 2021-11-13: 50 ug/min via INTRAVENOUS

## 2021-11-13 MED ORDER — OXYCODONE HCL 5 MG PO TABS: 5.0000 mg | ORAL_TABLET | ORAL | Status: DC | PRN

## 2021-11-13 MED ORDER — DIPHENHYDRAMINE HCL 25 MG PO CAPS: 25.0000 mg | ORAL_CAPSULE | Freq: Four times a day (QID) | ORAL | Status: DC | PRN

## 2021-11-13 MED ORDER — OXYTOCIN-SODIUM CHLORIDE 30-0.9 UT/500ML-% IV SOLN
INTRAVENOUS | Status: DC | PRN
  Administered 2021-11-13: 300 mL via INTRAVENOUS

## 2021-11-13 MED ORDER — MEPERIDINE HCL 25 MG/ML IJ SOLN: 6.2500 mg | INTRAMUSCULAR | Status: DC | PRN

## 2021-11-13 MED ORDER — GABAPENTIN 100 MG PO CAPS
100.0000 mg | ORAL_CAPSULE | Freq: Every day | ORAL | Status: DC
  Administered 2021-11-13 – 2021-11-15 (×3): 100 mg via ORAL
  Filled 2021-11-13 (×4): qty 1

## 2021-11-13 SURGICAL SUPPLY — 30 items
BARRIER ADHS 3X4 INTERCEED (GAUZE/BANDAGES/DRESSINGS) ×1 IMPLANT
CHLORAPREP W/TINT 26 (MISCELLANEOUS) ×1 IMPLANT
DRSG TELFA 3X8 NADH STRL (GAUZE/BANDAGES/DRESSINGS) ×1 IMPLANT
ELECT CAUTERY BLADE 6.4 (BLADE) ×1 IMPLANT
ELECT REM PT RETURN 9FT ADLT (ELECTROSURGICAL) ×1
ELECTRODE REM PT RTRN 9FT ADLT (ELECTROSURGICAL) ×1 IMPLANT
GAUZE SPONGE 4X4 12PLY STRL (GAUZE/BANDAGES/DRESSINGS) ×1 IMPLANT
GLOVE SURG SYN 8.0 (GLOVE) ×1 IMPLANT
GLOVE SURG SYN 8.0 PF PI (GLOVE) ×1 IMPLANT
GOWN STRL REUS W/ TWL LRG LVL3 (GOWN DISPOSABLE) ×2 IMPLANT
GOWN STRL REUS W/ TWL XL LVL3 (GOWN DISPOSABLE) ×1 IMPLANT
GOWN STRL REUS W/TWL LRG LVL3 (GOWN DISPOSABLE) ×2
GOWN STRL REUS W/TWL XL LVL3 (GOWN DISPOSABLE) ×1
MANIFOLD NEPTUNE II (INSTRUMENTS) ×1 IMPLANT
MAT PREVALON FULL STRYKER (MISCELLANEOUS) ×1 IMPLANT
NEEDLE HYPO 22GX1.5 SAFETY (NEEDLE) ×1 IMPLANT
NS IRRIG 1000ML POUR BTL (IV SOLUTION) ×1 IMPLANT
PACK C SECTION AR (MISCELLANEOUS) ×1 IMPLANT
PAD ABD 8X10 STRL (GAUZE/BANDAGES/DRESSINGS) IMPLANT
PAD OB MATERNITY 4.3X12.25 (PERSONAL CARE ITEMS) ×1 IMPLANT
PAD PREP 24X41 OB/GYN DISP (PERSONAL CARE ITEMS) ×1 IMPLANT
RETRACTOR TRAXI PANNICULUS (MISCELLANEOUS) IMPLANT
SCRUB CHG 4% DYNA-HEX 4OZ (MISCELLANEOUS) ×1 IMPLANT
STRAP SAFETY 5IN WIDE (MISCELLANEOUS) ×1 IMPLANT
SUT CHROMIC 1 CTX 36 (SUTURE) ×3 IMPLANT
SUT PLAIN GUT 0 (SUTURE) ×2 IMPLANT
SUT VIC AB 0 CT1 36 (SUTURE) ×2 IMPLANT
SYR 30ML LL (SYRINGE) ×2 IMPLANT
TRAP FLUID SMOKE EVACUATOR (MISCELLANEOUS) ×1 IMPLANT
WATER STERILE IRR 500ML POUR (IV SOLUTION) ×1 IMPLANT

## 2021-11-13 NOTE — Discharge Summary (Signed)
Obstetrical Discharge Summary  Patient Name: Katie Watson DOB: 1990/02/11 MRN: [id number]  Date of Admission: 11/12/2021 Date of Delivery: 11/13/21 Delivered by: Schermerhorn MD Date of Discharge: 11/16/2021  Primary OB: [facility] [id number] last menstrual period was 12/20/2020. EDC Estimated Date of Delivery: 11/11/21 Gestational Age at Delivery: [redacted]w[redacted]d   Antepartum complications:  Hx substance abuse, suboxone 8mg  daily Iron deficiency anemia Obesity BMI 40 Echogenic focus on anatomy US, normal materniT21 results.  Prediabetes, A1C 5.9->5.6 Tobacco use 1/2ppd ASthma Rubella NON-immune  Admitting Diagnosis: Obesity affecting pregnancy in third trimester [O99.213]  Secondary Diagnosis: Patient Active Problem List   Diagnosis Date Noted   Cesarean delivery delivered 11/16/2021   Acute blood loss anemia 11/16/2021   Postpartum hemorrhage 99991111   Obesity complicating pregnancy in third trimester 11/06/2021   Suboxone maintenance treatment complicating pregnancy, antepartum ([place]) 09/03/2021   Echogenic intracardiac focus of fetus on prenatal ultrasound 09/03/2021   Rubella non-immune status, antepartum 08/21/2021   Supervision of high risk pregnancy in third trimester 03/19/2021   Reactive airway disease, mild intermittent, uncomplicated [id number]   Opioid use disorder, severe, on maintenance therapy ([place]) 01/01/2020   Scoliosis 02/08/2011   Tobacco use disorder 02/08/2011    Discharge Diagnosis: Term Pregnancy Delivered      Augmentation: AROM, Pitocin, Cytotec, and Cook Cath Complications: None Intrapartum complications/course: active phase arrest 6 cm  Delivery Type: primary cesarean section, low transverse incision Anesthesia: epidural anesthesia, GETA Placenta: manual removal To Pathology: No  Laceration: none Episiotomy: none Newborn Data:female . Apgars 8/8  wt #6/14  Postpartum Procedures: transfusion 1u pRBC and Venofer  Edinburgh:      11/13/2021    8:45 AM  Flavia Shipper Postnatal Depression Scale Screening Tool  I have been able to laugh and see the funny side of things. 0  I have looked forward with enjoyment to things. 0  I have blamed myself unnecessarily when things went wrong. 0  I have been anxious or worried for no good reason. 0  I have felt scared or panicky for no good reason. 0  Things have been getting on top of me. 0  I have been so unhappy that I have had difficulty sleeping. 0  I have felt sad or miserable. 0  I have been so unhappy that I have been crying. 0  The thought of harming myself has occurred to me. 0  Edinburgh Postnatal Depression Scale Total 0     Post partum course: Cesarean Section:   Patient had an uncomplicated postpartum course.  By time of discharge on POD#3, her pain was controlled on oral pain medications; she had appropriate lochia and was ambulating, voiding without difficulty, tolerating regular diet and passing flatus.   She was deemed stable for discharge to home.    Discharge Physical Exam:  BP 106/73 (BP Location: Left Arm)   Pulse 87   Temp 98 F (36.7 C) (Oral)   Resp 18   Ht 5\' 8"  (1.727 m)   Wt 113.4 kg   LMP 12/20/2020   SpO2 99%   BMI 38.01 kg/m   General: NAD CV: RRR Pulm: nl effort ABD: s/nd/nt, fundus firm and below the umbilicus Lochia: moderate Perineum:minimal edema/intact Incision: c/d/I, covered with occlusive OP site dressing  DVT Evaluation: LE non-ttp, no evidence of DVT on exam.  Hemoglobin  Date Value Ref Range Status  11/16/2021 7.4 (L) 12.0 - 15.0 g/dL Final   HCT  Date Value Ref Range Status  11/16/2021 21.4 (L) 36.0 -  46.0 % Final    Risk assessment for postpartum VTE and prophylactic treatment: Very high risk factors: None High risk factors: Unscheduled cesarean after labor  Moderate risk factors: PPH > 1055ml and BMI 30-40 kg/m2  Postpartum VTE prophylaxis with LMWH not indicated  Disposition: stable, discharge to  home. Baby Feeding: breast and formula feeding Baby Disposition: home with mom  Rh Immune globulin indicated: No Rubella vaccine given: was given Varivax vaccine given: was not indicated Flu vaccine given in AP setting: No Tdap vaccine given in AP setting: No  Contraception: Depo-Provera  Prenatal Labs:  Blood type/Rh O pos  Antibody screen neg  Rubella NON-Immune  Varicella Immune  RPR NR  HBsAg Neg  HIV NR  GC neg  Chlamydia neg  Genetic screening negative  1 hour GTT 92  3 hour GTT    GBS Neg     Plan:  KARI MONTERO was discharged to home in good condition.   Discharge Medications: Allergies as of 11/16/2021       Reactions   Pertussis Immune Globulin Anaphylaxis        Medication List     STOP taking these medications    clindamycin 150 MG capsule Commonly known as: CLEOCIN       TAKE these medications    acetaminophen 500 MG tablet Commonly known as: TYLENOL Take 1 tablet (500 mg total) by mouth every 6 (six) hours as needed.   buprenorphine-naloxone 8-2 mg Subl SL tablet Commonly known as: SUBOXONE Place 1 tablet under the tongue 2 (two) times daily.   GOODY HEADACHE PO Take 1 packet by mouth daily as needed (pain).   ibuprofen 600 MG tablet Commonly known as: ADVIL Take 1 tablet (600 mg total) by mouth every 6 (six) hours as needed.   senna-docusate 8.6-50 MG tablet Commonly known as: Senokot-S Take 1 tablet by mouth at bedtime as needed for mild constipation.         Follow-up Information     Schermerhorn, Gwen Her, MD Follow up in 2 week(s).   Specialty: Obstetrics and Gynecology Why: routine postop appointment Contact information: [address] [place] [address] [phone]                 Signed: Clydene Laming 11/16/2021 9:33 AM

## 2021-11-13 NOTE — Progress Notes (Signed)
Labor Progress Note  MAKAYA JUNEAU is a 31 y.o. G1P0 at [redacted]w[redacted]d by LMP admitted for induction of labor due to Obesity, substance use d/o.  Subjective: intermittent hip and low back pain, overall comfortable with epidural.   CNM to room for decels and assessment.   Objective: BP 136/78   Pulse 87   Temp 99.1 F (37.3 C) (Oral)   Resp 16   Ht 5\' 8"  (1.727 m)   Wt 113.4 kg   LMP 12/20/2020   SpO2 97%   BMI 38.01 kg/m  Notable VS details: reviewed.     Vitals:   11/12/21 0124 11/12/21 0532 11/12/21 0710 11/12/21 1215  BP: 117/77 120/75 121/62 134/84   11/12/21 1630 11/12/21 2124 11/12/21 2223 11/12/21 2238  BP: 135/89 (!) 149/74 132/78 128/69   11/12/21 2329  BP: 136/78   FSE applied to better trace FHR with maternal position changes with noted decels.   Fetal Assessment: FHT:  FHR: 120 bpm, variability: moderate,  accelerations:  Present,  decelerations:  Present early and variable - early and variables to 90bpm Category/reactivity:  Category II UC:   regular, q2-45min, Pitocin at 55mu, MVUs inadequate 140-180.  SVE:  6/80/-2, some molding noted. No cervical change or descent since AROM.    Membrane status: AROM at 1817 Amniotic color: clear fluid  Labs: Lab Results  Component Value Date   WBC 11.0 (H) 11/12/2021   HGB 10.1 (L) 11/12/2021   HCT 29.7 (L) 11/12/2021   MCV 85.6 11/12/2021   PLT 195 11/12/2021    Assessment / Plan: G1P0 at [redacted]w[redacted]d with IOL due to morbid obesity and substance use d/o  Labor: s/p 3 doses of cytotec, s/p Cook cath, and titrating pitocin. MVUs inadequate, attempt to titrate pitocin with cat II tracing, mod variability, overall reassuring tracing, improved with maternal position change to high fowlers now. Dr Ouida Sills updated regarding current and recent FHR tracing, cervix dilation and lack of descent.  Preeclampsia:  no e/o pre-E, normal baseline labs on admit.  Fetal Wellbeing:  Category II Pain Control:  Epidural I/D:   GBS  Neg   Murray Hodgkins Tikita Mabee, CNM 11/13/2021, 12:19 AM       ;

## 2021-11-13 NOTE — Transfer of Care (Signed)
Immediate Anesthesia Transfer of Care Note  Patient: Katie Watson  Procedure(s) Performed: CESAREAN SECTION  Patient Location: LDR1  Anesthesia Type:General and Epidural  Level of Consciousness: awake, alert , and oriented  Airway & Oxygen Therapy: Patient Spontanous Breathing  Post-op Assessment: Report given to RN and Post -op Vital signs reviewed and stable  Post vital signs: Reviewed and stable  Last Vitals:  Vitals Value Taken Time  BP 145/94   Temp    Pulse 104   Resp 11   SpO2 98     Last Pain:  Vitals:   11/12/21 2329  TempSrc: Oral  PainSc:          Complications: No notable events documented.

## 2021-11-13 NOTE — Anesthesia Postprocedure Evaluation (Signed)
Anesthesia Post Note  Patient: Katie Watson  Procedure(s) Performed: [place]  Patient location during evaluation: Mother Baby Anesthesia Type: Epidural Level of consciousness: oriented and awake and alert Pain management: pain level controlled Vital Signs Assessment: post-procedure vital signs reviewed and stable Respiratory status: spontaneous breathing and respiratory function stable Cardiovascular status: blood pressure returned to baseline and stable Postop Assessment: no headache, no backache, no apparent nausea or vomiting and able to ambulate Anesthetic complications: no  No notable events documented.   Last Vitals:  Vitals:   11/13/21 0600 11/13/21 0759  BP:  (!) 121/53  Pulse: 94 (!) 101  Resp:  18  Temp:  37.1 C  SpO2: 96% 97%    Last Pain:  Vitals:   11/13/21 0759  TempSrc: Oral  PainSc:                  [name]

## 2021-11-13 NOTE — Lactation Note (Addendum)
This note was copied from a baby's chart. Lactation Consultation Note  Patient Name: Katie Watson Date: 11/13/2021 Reason for consult: Initial assessment;Primapara;Term Age:33 hours  Maternal Data Has patient been taught Hand Expression?: Yes Does the patient have breastfeeding experience prior to this delivery?: No  Feeding Mother's Current Feeding Choice: Breast Milk and Formula Assisted mom with latching baby to Right breast, right nipple red on tip and dried blood, mom shown how to apply 20 mm nipple shield, nipple will evert in to nipple shield, baby rooting  but difficult to position at breast as baby fussy when moved, when calmed and not pushed would begin opening mouth and then attempt sucking, unccordinated suck but did eventually begin nursing if allowed to lick and taste with gentle shaping of breast to keep shield intact, was able to nurse x approx 15 min with starts and stops but pinching felt per mom and when shield removed there was a small amt of blood in the shield.DEBP set up with instruction and prepumped right breast to evert nipple (this nipple more everted than  left)and 20 mm nipple shield place, baby positioned in football hold and gently moved to breast, latched easily if not pushed and mom states he breastfed 30 min on this side and noted that there was colostrum drops in shield   LATCH Score Latch: Repeated attempts needed to sustain latch, nipple held in mouth throughout feeding, stimulation needed to elicit sucking reflex.  Audible Swallowing: A few with stimulation  Type of Nipple: Flat  Comfort (Breast/Nipple): Filling, red/small blisters or bruises, mild/mod discomfort  Hold (Positioning): Full assist, staff holds infant at breast  LATCH Score: 4   Lactation Tools Discussed/Used Tools: Nipple [place] Fuel;Pump Nipple shield size: 20 Breast pump type: Double-Electric Breast Pump Pump Education: Setup, frequency, and cleaning;Milk  Storage Reason for Pumping: to evert nipples Pumping frequency: before nursing  Interventions Interventions: Breast feeding basics reviewed;Assisted with latch;Skin to skin;Hand express;Adjust position;Breast compression;Pre-pump if needed;Support pillows;Position options;DEBP;Education (purelan given with instruction in use)given to mom to apply to left nipple, given Medela nipple shield instruction sheet, reviewed breastfeeding basics in educational book and hand expression pictures Lanier name and no written on white board Discharge Pump: Personal;DEBP WIC Program: No  Consult Status Consult Status: Follow-up Date: 11/13/21 Follow-up type: In-patient    Ferol Luz 11/13/2021, 12:56 PM

## 2021-11-13 NOTE — Lactation Note (Signed)
This note was copied from a baby's chart. Lactation Consultation Note  Patient Name: Katie Watson [id number] Date: 11/13/2021 Reason for consult: Difficult latch;Follow-up assessment;RN request Age:33 hours  Maternal Data Has patient been taught Hand Expression?: Yes Does the patient have breastfeeding experience prior to this delivery?: No  Feeding    LATCH Score Latch: Repeated attempts needed to sustain latch, nipple held in mouth throughout feeding, stimulation needed to elicit sucking reflex.  Audible Swallowing: None  Type of Nipple: Flat  Comfort (Breast/Nipple): Filling, red/small blisters or bruises, mild/mod discomfort  Hold (Positioning): No assistance needed to correctly position infant at breast.  LATCH Score: 5  Lactation Tools Discussed/Used Tools: Nipple Shields Nipple shield size: 20 Breast pump type: Double-Electric Breast Pump Pump Education: Setup, frequency, and cleaning Reason for Pumping: left side sore and previously bleeding Pumped volume:  (drops)  Interventions Interventions: Breast feeding basics reviewed;Assisted with latch;Adjust position;Support pillows;Position options;DEBP;Infant Driven Feeding Algorithm education Approximately 15 minutes of occasional rhythmic suckling on right side in football hold with nipple shield. No colostrum noted in shield and only one swallow heard in session. Frequent crying, repeatedly unlatches, and has minimal tongue extension beyond lower gum line. Holds hands close to face while at breast. Swaddled and cross cradle on right side attempted. Mom prefers football. Feeding cues reviewed as well as expected output and weight over the first week. Mom reports spasm in her back and also would like to eat dinner brought by support person. Pumped on left side due to soreness with 75mm flange and obtained drops fed to baby with syringe. Pacifiers discouraged until feeding is well established, but parents may use one  momentarily prior to this next feeding for mom to have time for self care and to calm baby. To attempt breastfeeding again by 10pm and encouraged to call [facility] for assistance.  Discharge Pump:  (States that Medicaid sent her a pump)  Consult Status Consult Status: Follow-up    Lenore Cordia 11/13/2021, 10:28 PM

## 2021-11-13 NOTE — Anesthesia Post-op Follow-up Note (Signed)
  Anesthesia Pain Follow-up Note  Patient: Katie Watson [facility]  Day #: 1  Date of Follow-up: 11/13/2021 Time: 8:02 AM  Last Vitals:  Vitals:   11/13/21 0600 11/13/21 0759  BP:  (!) 121/53  Pulse: 94 (!) 101  Resp:  18  Temp:  37.1 C  SpO2: 96% 97%    Level of Consciousness: alert  Pain: none   Side Effects:None  Catheter Site Exam:clean, dry, no drainage  Anti-Coag Meds (From admission, onward)    Start     Dose/Rate Route Frequency Ordered Stop   11/14/21 0200  enoxaparin (LOVENOX) injection 40 mg        40 mg Subcutaneous Every 24 hours 11/13/21 0328          Plan: D/C from anesthesia care at surgeon's request  [place]

## 2021-11-13 NOTE — Anesthesia Procedure Notes (Signed)
Procedure Name: Intubation Date/Time: 11/13/2021 2:13 AM  Performed by: Aline Brochure, CRNAPre-anesthesia Checklist: Patient identified, Patient being monitored, Timeout performed, Emergency Drugs available and Suction available Patient Re-evaluated:Patient Re-evaluated prior to induction Oxygen Delivery Method: Circle system utilized Preoxygenation: Pre-oxygenation with 100% oxygen Induction Type: IV induction and Rapid sequence Laryngoscope Size: 3 and McGraph Grade View: Grade I Tube type: Oral Tube size: 6.5 mm Number of attempts: 1 Airway Equipment and Method: Stylet and Video-laryngoscopy Placement Confirmation: ETT inserted through vocal cords under direct vision, positive ETCO2 and breath sounds checked- equal and bilateral Secured at: 19 cm Tube secured with: Tape Dental Injury: Teeth and Oropharynx as per pre-operative assessment

## 2021-11-13 NOTE — Brief Op Note (Signed)
11/12/2021 - 11/13/2021  2:43 AM  PATIENT:  Katie Watson  31 y.o. female  PRE-OPERATIVE DIAGNOSIS:  active phase arrest  POST-OPERATIVE DIAGNOSIS:  same , nuchal cord   PROCEDURE:  Procedure(s): CESAREAN SECTION, LTCS   SURGEON:  Surgeon(s) and Role:    * Aayra Hornbaker, Gwen Her, MD - Primary  PHYSICIAN ASSISTANT: McVey , CNM  ASSISTANTS: CST  ANESTHESIA:   cle - converted to GETA  EBL:  [id number]   BLOOD ADMINISTERED:none  DRAINS: none   LOCAL MEDICATIONS USED:  MARCAINE     SPECIMEN:  No Specimen  DISPOSITION OF SPECIMEN:  N/A  COUNTS:  YES  TOURNIQUET:  * No tourniquets in log *  DICTATION: .Other Dictation: Dictation Number verbal  PLAN OF CARE: Admit to inpatient   PATIENT DISPOSITION:  PACU - hemodynamically stable.   Delay start of Pharmacological VTE agent (>24hrs) due to surgical blood loss or risk of bleeding: not applicable

## 2021-11-13 NOTE — Progress Notes (Signed)
S: pt with some back pain, overall comfortable.   O: FHR Cat II- deeper variables, started amnioinfusion 350ml bolus then 123ml per hour. Noted marked variability then decels to 80-90bpm, episodic, unrelated to UCs.  SVE repeat 5-6/80/-1, descent now, but with FHR tracing worsening.  PItocin DC, Amnioinfusion DC- improving tracing with continued variables.   A: Cat II tracing, nonreassuring monitoring.   P: called Dr Ouida Sills, notified of tracing.  PLan to proceed to [place], CNM 11/13/2021 1:23 AM

## 2021-11-13 NOTE — Op Note (Unsigned)
NAME: Katie Watson, Katie Watson MEDICAL RECORD NO: [id number] ACCOUNT NO: [id number] DATE OF BIRTH: 12-10-1990 FACILITY: [facility] LOCATION: [facility] PHYSICIAN: Boykin Nearing, MD  Operative Report   DATE OF PROCEDURE: 11/13/2021  PREOPERATIVE DIAGNOSES: 1.  A 40+2 weeks estimated gestational age. 2.  Active phase arrest.  POSTOPERATIVE DIAGNOSES: 1.  A 40+2 weeks estimated gestational age. 2.  Active phase arrest.  PROCEDURE:  Primary low transverse cesarean section.  ANESTHESIA:  Continuous lumbar epidural converted to general endotracheal anesthesia.  SURGEON:  Boykin Nearing, MD  FIRST ASSISTANT:  Hassan Buckler, certified nurse midwife.  INDICATIONS:  A 31 year old gravida 1, para 0.  Patient's labor was induced at 40+1 weeks.  She labored throughout the day and then hospital day #2, the patient did not progress past 6 cm despite intravenous Pitocin.  She was at 6 cm for greater than 6  hours, approximately 7 hours.  DESCRIPTION OF PROCEDURE:  After adequate continuous lumbar epidural, the patient was prepped and draped in normal sterile fashion.  Timeout was performed.  A Pfannenstiel incision was made 2 fingerbreadths above the symphysis pubis, after placing the  Traxi pannus elevator.  After making our initial incision, the patient was complaining of pain and therefore anesthesiologist felt it was prudent to place the patient under general endotracheal anesthesia, which was performed.  The subcutaneous tissues  were then dissected.  The fascia was identified and scored in the midline and was opened in transverse fashion bilaterally.  Superior aspect of fascia was grasped with Kocher clamps and the recti muscles were dissected free.  Inferior aspect of fascia  was grasped with Kocher clamps and the pyramidalis muscle was dissected free.  Entry into the peritoneal cavity was accomplished sharply.  Upon entry, the vesicouterine peritoneal fold was identified and a bladder  flap was created and the bladder was  reflected inferiorly.  A low transverse uterine incision was made.  Upon entry into the endometrial cavity, clear fluid resulted.  The uterine incision was extended with blunt transverse traction.  A asynclitic fetal head with caput was elevated through  the incision and with fundal pressure, the head was delivered.  A nuchal cord was identified and was reduced and the shoulders and body were delivered without difficulty.  A vigorous female, the cord was then doubly clamped and passed to the nursery staff  who assigned Apgar scores of 8 and 8, fetal weight 6 pounds 14 ounces.  The placenta was manually delivered and the uterus was then exteriorized.  Uterus was extremely atonic and the patient was given intravenous Pitocin and 0.2 mg intramuscular  Methergine.  The uterine incision was then closed with 1 chromic suture in a running locking fashion with good approximation of edges.  Good hemostasis noted.  Fallopian tubes and ovaries appeared normal.  Posterior cul-de-sac was then irrigated and  suctioned and the uterus was placed back into the abdominal cavity.  The pericolic gutters were wiped clean with laparotomy tape.  Again, the uterine incision appeared hemostatic.  Fascia was then closed with 0 Vicryl suture in a running nonlocking  fashion, two separate sutures used.  Good approximation of fascial tissues.  Fascial edges were then injected with a solution of 60 mL 0.5% Marcaine plus 20 mL normal saline.  Approximately 50 mL of this solution was used.  Subcutaneous tissues were  irrigated and bovied and the skin was reapproximated with Insorb absorbable staples and additional 30 mL of Marcaine solution was injected beneath the skin.  There were  no complications.  QUANTITATIVE BLOOD LOSS: 1250 mL.  URINE OUTPUT: 180 mL.  INTRAOPERATIVE FLUIDS:  1 liter.  MEDICATIONS:  Patient did receive 3 grams intravenous Ancef prior to commencement of the case and 500 mg  azithromycin for surgical prophylaxis.   MUK D: 11/13/2021 2:57:14 am T: 11/13/2021 3:10:00 am  JOB: [id number]/ [id number]

## 2021-11-14 ENCOUNTER — Encounter: Payer: Self-pay | Admitting: Obstetrics and Gynecology

## 2021-11-14 NOTE — Progress Notes (Signed)
Postop Day  1  Subjective: no complaints, up ad lib, voiding, and tolerating PO  Doing well, no concerns. Ambulating without difficulty, pain managed with PO meds, tolerating regular diet, and voiding without difficulty.   No fever/chills, chest pain, shortness of breath, nausea/vomiting, or leg pain. No nipple or breast pain. No headache, visual changes, or RUQ/epigastric pain.  Objective: BP 101/64 (BP Location: Left Arm)   Pulse 82   Temp 98.5 F (36.9 C) (Oral)   Resp 18   Ht _0  (1.727 m)   Wt 113.4 kg   LMP 12/20/2020   SpO2 99%   BMI 38.01 kg/m    Physical Exam:  General: alert, cooperative, and no distress Breasts: soft/nontender CV: RRR Pulm: nl effort, CTABL Abdomen: soft, non-tender, active bowel sounds Uterine Fundus: firm Incision: no significant drainage, covered with pressure dressing Perineum: minimal edema, intact Lochia: appropriate DVT Evaluation: No evidence of DVT seen on physical exam.  Recent Labs    11/12/21 0130 11/13/21 0650  HGB 10.1* 8.3*  HCT 29.7* 23.8*  WBC 11.0* 18.4*  PLT 195 185    Assessment/Plan: 31 y.o. G1P0 postpartum day # 1  -Continue routine postpartum care -Lactation consult PRN for breastfeeding  -Acute blood loss anemia - hemodynamically stable and asymptomatic; start PO ferrous sulfate BID with stool softeners  -S/P 1 dose of IVvenofer -Immunization status:   needs MMR prior to discharge  -Abdominal binder PRN  -Up to shower today, remove pressure dressing in shower -Replace dressing with occlusive OP site dressing    Disposition: Continue inpatient postpartum care   LOS: 2 days   Minda Meo, CNM 11/14/2021, 9:11 AM   ----- Drinda Butts  Certified Nurse Midwife [facility]

## 2021-11-14 NOTE — Lactation Note (Signed)
This note was copied from a baby's chart. Lactation Consultation Note  Patient Name: Katie Watson RUEAV'W Date: 11/14/2021 Reason for consult: Follow-up assessment;Term Age:33 hours  Maternal Data  This is mother's first baby via caesarean section for indication of fetal heart rate. Maternal history of substance abuse (currently taking Suboxone)  , and tobacco use (1/2 ppd) during pregnancy.  Today on follow up Mom stated concerns of baby's 4% weight loss. Mom stated concerns of baby being unsettled throughout the night and "not sure if he's getting enough". Mom stated her nipples were sore and sensitive and needed help with latch using the nipple shield.          Does the patient have breastfeeding experience prior to this delivery?: No  Feeding Mother's Current Feeding Choice: Breast Milk and Formula  Assisted mom with feeding baby on the right breast with the use of the nipple shield. Provided mom with tips and strategies to maximize position in the football hold which improved latch. Audible swallows were heard. Encouraged mom to provide tactile stimulation to baby as encouragement to continue to feed. Switched to the left breast with the nipple shield and audible swallows were noted as well. Provided information on supplementation after each breast feeding attempt. Formula supplementation was given following breastfeeding per Pediatrician's order.      LATCH Score   The latch score is a 8.See 12:20PM Flowsheet documentation.      Lactation Tools Discussed/Used  DEBP    Interventions Interventions: Breast feeding basics reviewed;Education (Assessed both of mother nipples and educated on the use of nipple cream and when to use post every breast feed and pumping session)   Educated mom on breast pumping to increase milk supply. Mom states she was provided with a breast pump through her insurance Medicaid. She does not recall the name but she will find out and  will report back to [facility]. Educated mom on cluster feeding and what to expect within the next couple of days. Mom verbalized understanding.      Consult Status Consult Status: Follow-up Date: 11/14/21 Follow-up type: In-patient   Report provided to the Care Nurse.     Marjorie Smolder 11/14/2021, 1:59 PM

## 2021-11-15 ENCOUNTER — Inpatient Hospital Stay: Payer: Medicaid Other | Admitting: Anesthesiology

## 2021-11-15 LAB — CBC
HCT: 18.1 % — ABNORMAL LOW (ref 36.0–46.0)
Hemoglobin: 6.1 g/dL — ABNORMAL LOW (ref 12.0–15.0)
MCH: 29.6 pg (ref 26.0–34.0)
MCHC: 33.7 g/dL (ref 30.0–36.0)
MCV: 87.9 fL (ref 80.0–100.0)
Platelets: 178 10*3/uL (ref 150–400)
RBC: 2.06 MIL/uL — ABNORMAL LOW (ref 3.87–5.11)
RDW: 13.5 % (ref 11.5–15.5)
WBC: 10.3 10*3/uL (ref 4.0–10.5)
nRBC: 0 % (ref 0.0–0.2)

## 2021-11-15 LAB — HEMOGLOBIN AND HEMATOCRIT, BLOOD
HCT: 21.9 % — ABNORMAL LOW (ref 36.0–46.0)
Hemoglobin: 7.6 g/dL — ABNORMAL LOW (ref 12.0–15.0)

## 2021-11-15 LAB — PREPARE RBC (CROSSMATCH)

## 2021-11-15 LAB — ABO/RH: ABO/RH(D): O POS

## 2021-11-15 MED ORDER — SODIUM CHLORIDE 0.9% IV SOLUTION: Freq: Once | INTRAVENOUS | Status: AC

## 2021-11-15 NOTE — Lactation Note (Addendum)
This note was copied from a baby's chart. Lactation Consultation Note  Patient Name: Katie Watson SKAJG'O Date: 11/15/2021 Reason for consult: Follow-up assessment;Primapara;1st time breastfeeding;Term;Breastfeeding assistance;Other (Comment) Age:33 hours  Maternal Data This is moms first baby, baby born via caesarean section due to fetal heart rate. Mom with history of substance use and currently on Suboxone, smoking 1/2 a pack of cigarettes daily during pregnancy, obesity, and anemia.   Today at follow up, mom welcomed latch support. Past 4 feedings were formula and mom verbalized she wanted to continue to breastfeed, per mom she pumped once and saved 0.5 mL of colostrum in a syringe. Mom reports unclear on how to give colostrum collected to baby. Mom reports sore nipples use and used Lanolin cream after latch.  Of note: Mom receiving blood transfusion when lactation entered room to assist.  Does the patient have breastfeeding experience prior to this delivery?: No  Feeding Mother's Current Feeding Choice: Breast Milk and Formula Nipple Type: Slow - flow  Assisted mom with feeding baby on the right breast using size 20 mm nipple shields that mom was able to put on herself. Assisted with positioning baby in football hold and skin to skin during latch. Audible swallows were heard and baby nursed 10 minutes on right breast doing massage and breast compressions, mom burped baby and offered left breast. Mom was able to apply nipple shield on her own and independently latched baby to the breast and baby sustained comfortable latch for another 10 minutes with audible swallows heard. After that, dad swaddled baby and paced bottle fed Similac 360 formula in 22 milliliters with taking breaks to burp 2-3 x. Formula given due to pediatricians order to supplement after each breastfeeding session.   LATCH Score Latch: Grasps breast easily, tongue down, lips flanged, rhythmical sucking.  Audible  Swallowing: Spontaneous and intermittent  Type of Nipple: Everted at rest and after stimulation (everted, but using nipple shield)  Comfort (Breast/Nipple): Filling, red/small blisters or bruises, mild/mod discomfort  Hold (Positioning): Assistance needed to correctly position infant at breast and maintain latch. (mother was able to independently position baby at second breast)  LATCH Score: 8   Lactation Tools Discussed/Used Nipple shield size: 20  Interventions Interventions: Breast massage;Breast compression;Adjust position;Support pillows;Education (Lanolin Cream); Drips of colostrum at mothers breast with syringe.   Discharge Pump: Personal (has personal double electric pump through [employer])  Consult Status Consult Status: Follow-up Date: 11/16/21 Follow-up type: In-patient Updated Nurse on Lactation Consult details    Debara Pickett Pathway to Lactation Student.  11/15/2021, 3:53 PM

## 2021-11-15 NOTE — Progress Notes (Signed)
Postop Day  2  Subjective: no complaints, up ad lib, voiding, and tolerating PO  Doing well, no concerns. Ambulating without difficulty, pain managed with PO meds, tolerating regular diet, and voiding without difficulty.   No fever/chills, chest pain, shortness of breath, nausea/vomiting, or leg pain. No nipple or breast pain. No headache, visual changes, or RUQ/epigastric pain.  Objective: BP (!) 122/58 (BP Location: Right Arm)   Pulse 84   Temp 98.6 F (37 C) (Oral)   Resp 20   Ht _0  (1.727 m)   Wt 113.4 kg   LMP 12/20/2020   SpO2 96%   BMI 38.01 kg/m    Physical Exam:  General: alert, cooperative, no distress, and pale Breasts: soft/nontender CV: RRR Pulm: nl effort, CTABL Abdomen: soft, non-tender, active bowel sounds Uterine Fundus: firm Incision: no significant drainage, covered with occlusive OP site  Perineum: minimal edema, intact Lochia: appropriate DVT Evaluation: No evidence of DVT seen on physical exam.     Latest Ref Rng & Units 11/15/2021    6:18 AM 11/13/2021    6:50 AM 11/12/2021    1:30 AM  CBC  WBC 4.0 - 10.5 K/uL 10.3  18.4  11.0   Hemoglobin 12.0 - 15.0 g/dL 6.1  8.3  10.1   Hematocrit 36.0 - 46.0 % 18.1  23.8  29.7   Platelets 150 - 400 K/uL 178  185  195     Assessment/Plan: 31 y.o. G1P0 postpartum day # 2  -Continue routine postpartum care Encouraged snug fitting bra, cold application, Tylenol PRN, and cabbage leaves for engorgement for formula feeding  -Acute blood loss anemia - hemodynamically stable and asymptomatic. Given 1 dose of IV venofer yesterday. Discussed CBC with Dr. Ouida Sills.  Recommend 1 unit pRBC d/t hgb < 7 and s/p PPH.  I discussed risk/benefits of blood transfusion with Katie Watson.  Questions answered.  She consents for blood transfusion.  Consent previously signed in clinic.   -Will plan for CBC 4 hours post transfusion. -Immunization status:   needs MMR prior to discharge    Disposition: Continue inpatient  postpartum care    LOS: 3 days   Katie Watson, CNM 11/15/2021, 9:56 AM   ----- Katie Watson  Certified Nurse Midwife [facility]

## 2021-11-15 NOTE — Progress Notes (Signed)
Patient's Right lower abdomen with 3 tiny red areas (previous blisters) noted. One area is covered with a bandaid with tiny amount rubra drainage noted.

## 2021-11-15 NOTE — Anesthesia Postprocedure Evaluation (Signed)
Anesthesia Post Note  Patient: Katie Watson  Procedure(s) Performed: AN AD [place]  Patient location during evaluation: Mother Baby Anesthesia Type: Epidural Level of consciousness: awake and alert Pain management: pain level controlled Vital Signs Assessment: post-procedure vital signs reviewed and stable Respiratory status: spontaneous breathing, nonlabored ventilation and respiratory function stable Cardiovascular status: stable Postop Assessment: no headache, no backache and epidural receding Anesthetic complications: no   No notable events documented.   Last Vitals:  Vitals:   11/15/21 0739 11/15/21 0741  BP: (!) 107/47 (!) 122/58  Pulse: 83 84  Resp: 20   Temp: 37 C   SpO2: 96%     Last Pain:  Vitals:   11/15/21 0755  TempSrc:   PainSc: 0-No pain                 [name]

## 2021-11-16 DIAGNOSIS — D62 Acute posthemorrhagic anemia: Secondary | ICD-10-CM | POA: Diagnosis not present

## 2021-11-16 LAB — CBC
HCT: 21.4 % — ABNORMAL LOW (ref 36.0–46.0)
Hemoglobin: 7.4 g/dL — ABNORMAL LOW (ref 12.0–15.0)
MCH: 30.5 pg (ref 26.0–34.0)
MCHC: 34.6 g/dL (ref 30.0–36.0)
MCV: 88.1 fL (ref 80.0–100.0)
Platelets: 185 10*3/uL (ref 150–400)
RBC: 2.43 MIL/uL — ABNORMAL LOW (ref 3.87–5.11)
RDW: 13.6 % (ref 11.5–15.5)
WBC: 10.9 10*3/uL — ABNORMAL HIGH (ref 4.0–10.5)
nRBC: 0 % (ref 0.0–0.2)

## 2021-11-16 LAB — TYPE AND SCREEN
ABO/RH(D): O POS
Antibody Screen: NEGATIVE
Unit division: 0

## 2021-11-16 LAB — BPAM RBC
Blood Product Expiration Date: 202312072359
ISSUE DATE / TIME: 202311051113
Unit Type and Rh: 5100

## 2021-11-16 MED ORDER — ACETAMINOPHEN 500 MG PO TABS: 500.0000 mg | ORAL_TABLET | Freq: Four times a day (QID) | ORAL | Status: AC | PRN

## 2021-11-16 MED ORDER — MEASLES, MUMPS & RUBELLA VAC IJ SOLR
0.5000 mL | Freq: Once | INTRAMUSCULAR | Status: AC
  Administered 2021-11-16: 0.5 mL via SUBCUTANEOUS
  Filled 2021-11-16: qty 0.5

## 2021-11-16 MED ORDER — IBUPROFEN 600 MG PO TABS: 600.0000 mg | ORAL_TABLET | Freq: Four times a day (QID) | ORAL | Status: AC | PRN

## 2021-11-16 MED ORDER — SENNOSIDES-DOCUSATE SODIUM 8.6-50 MG PO TABS: 1.0000 | ORAL_TABLET | Freq: Every evening | ORAL | Status: AC | PRN

## 2021-11-16 NOTE — Progress Notes (Signed)
Patient discharged home with infant. Discharge instructions and prescriptions given and reviewed with patient. Patient verbalized understanding. Pt will be staying after discharge with baby that is a eat, sleep, console pt.

## 2021-11-16 NOTE — Discharge Instructions (Signed)
Discharge Instructions:   Follow-up Appointment: Nov. 20th @ 3:15pm  If there are any new medications, they have been ordered and will be available for pickup at the listed pharmacy on your way home from the hospital.   Call office if you have any of the following: headache, visual changes, fever >101.0 F, chills, shortness of breath, breast concerns, excessive vaginal bleeding, incision drainage or problems, leg pain or redness, depression or any other concerns. If you have vaginal discharge with an odor, let your doctor know.   It is normal to bleed for up to 6 weeks. You should not soak through more than 1 pad in 1 hour. If you have a blood clot larger than your fist with continued bleeding, call your doctor.   After a c-section, you should expect a small amount of blood or clear fluid coming from the incision and abdominal cramping/soreness. Inspect your incision site daily. Stand in front of a mirror to look for any redness, incision opening, or discolored/odorness drainage. Take a shower daily and continue good hygiene. Use own towel and washcloth (do not share). Make sure your sheets on your bed are clean. No pets sleeping around your incision site. Dressing will be removed at your postpartum visit. If the dressing does become wet or soiled underneath, it is okay to remove it.   On-Q pump: You will remove on day 5 after insertion or if the ball becomes flat before day 5. You will remove on: May 14, 2018  Activity: Do not lift > 10 lbs for 6 weeks (do not lift anything heavier than your baby). No intercourse, tampons, swimming pools, hot tubs, baths (only showers) for 6 weeks.  No driving for 1-2 weeks. Continue prenatal vitamin, especially if breastfeeding. Increase calories and fluids (water) while breastfeeding.   Your milk will come in, in the next couple of days (right now it is colostrum). You may have a slight fever when your milk comes in, but it should go away on its own.  If it  does not, and rises above 101 F please call the doctor. You will also feel achy and your breasts will be firm. They will also start to leak. If you are breastfeeding, continue as you have been and you can pump/express milk for comfort.   If you have too much milk, your breasts can become engorged, which could lead to mastitis. This is an infection of the milk ducts. It can be very painful and you will need to notify your doctor to obtain a prescription for antibiotics. You can also treat it with a shower or hot/cold compress.   For concerns about your baby, please call your pediatrician.  For breastfeeding concerns, the lactation consultant can be reached at [phone].   Postpartum blues (feelings of happy one minute and sad another minute) are normal for the first few weeks but if it gets worse let your doctor know.   Congratulations! We enjoyed caring for you and your new bundle of joy!

## 2021-11-16 NOTE — Lactation Note (Signed)
This note was copied from a baby's chart. Lactation Consultation Note  Patient Name: Katie Watson KDTOI'Z Date: 11/16/2021 Reason for consult: Follow-up assessment;Primapara;Term Age:33 days  Maternal Data Has patient been taught Hand Expression?: Yes Does the patient have breastfeeding experience prior to this delivery?: No  First time mom rooming-in with newborn for 5 days d/t hx of substance abuse and currently taking suboxone.   Feeding Mother's Current Feeding Choice: Breast Milk and Formula Nipple Type: Slow - flow  Mom is breast and bottle feeding per pediatrician orders to provide formula post BF sessions. Mom is feeling confident with positioning, latching, and pumping.  LATCH Score    Lactation Tools Discussed/Used Tools: Pump;Nipple [place] Fuel;Bottle Breast pump type: Double-Electric Breast Pump  Reviewed with mom pumping post feedings for 10 minutes if baby goes to breast, full pumping session of 15-66mins if baby does not go to the breast first.  Interventions Interventions: Breast feeding basics reviewed;Hand express;Education;DEBP;Pace feeding  Reviewed stomach size, appropriate volumes, pace bottle feeding.  Discharge    Consult Status Consult Status: PRN  Updated whiteboard; encouraged to call out as needed.  Lavonia Drafts 11/16/2021, 11:01 AM

## 2023-08-04 ENCOUNTER — Telehealth: Payer: Self-pay

## 2023-08-04 DIAGNOSIS — Z7189 Other specified counseling: Secondary | ICD-10-CM

## 2023-08-04 NOTE — Progress Notes (Signed)
 Complex Care Management Note  Care Guide Note 08/04/2023 Name: Katie Watson MRN: [id number] DOB: Nov 01, 1990  Katie Watson is a 33 y.o. year old female who sees Aisha Heller, [place] for primary care. I reached out to [name] by phone today to offer complex care management services.  [name] was given information about Complex Care Management services today including:   The Complex Care Management services include support from the care team which includes your Nurse Care Manager, Clinical Social Worker, or Pharmacist.  The Complex Care Management team is here to help remove barriers to the health concerns and goals most important to you. Complex Care Management services are voluntary, and the patient may decline or stop services at any time by request to their care team member.   Complex Care Management Consent Status: Patient agreed to services and verbal consent obtained.   Follow up plan:  Telephone appointment with complex care management team member scheduled for:  BSW 08/09/2023  Encounter Outcome:  Patient Scheduled  Jeoffrey Buffalo , RMA     [place]  [facility], [facility] Guide  Direct Dial: [phone]  Website: [email or web address]

## 2023-08-09 ENCOUNTER — Other Ambulatory Visit: Payer: Self-pay

## 2023-08-09 NOTE — Patient Instructions (Signed)
 Visit Information  Thank you for taking time to visit with me today. Please don't hesitate to contact me if I can be of assistance to you before our next scheduled appointment.    Following is a copy of your care plan:   Goals Addressed             This Visit's Progress    BSW VBCI Social Work Care Plan       Problems:   Patient needs to establish a primary care provider.  CSW Clinical Goal(s):   Over the next 2 weeks the Patient will attend all scheduled medical appointments as evidenced by patient report and care team review of appointment completion in electronic MEDICAL RECORD NUMBERat Primary Care @[place]  will follow up with Medicaid Transportation through [employer] as directed by Social Work.  Interventions:  Social Determinants of Health in Patient with Scoliosis: SDOH assessments completed: Establish with PCP Evaluation of current treatment plan related to unmet needs SW contact Primary Care at [facility] and patient is scheduled 08/23/23 at 10:10am to be seen.  Patient is informed that Medicaid Transportation can assist with a ride to the appointment.    Patient Goals/Self-Care Activities:  Patient will attend medical appointment on 08/23/23 and contact [employer] to scheduled Medicaid transportation.   Plan:   No further follow up required: Patient does not request a follow up visit.        Please call 911 if you are experiencing a Mental Health or Behavioral Health Crisis or need someone to talk to.  Patient verbalizes understanding of instructions and care plan provided today and agrees to view in MyChart. Active MyChart status and patient understanding of how to access instructions and care plan via MyChart confirmed with patient.     Tillman Gardener, BSW [place]  [facility], [facility] Social Worker Direct Dial: [phone]  Fax: [phone] Website: [email or web address]

## 2023-08-09 NOTE — Patient Outreach (Signed)
 Complex Care Management   Visit Note  08/09/2023  Name:  Katie Watson MRN: [id number] DOB: Oct 07, 1990  Situation: Referral received for Complex Care Management related to establish a PCP I obtained verbal consent from Patient.  Visit completed with patient  on the phone  Background:   Past Medical History:  Diagnosis Date   Kyphosis deformity of spine     Assessment:  Patient reports she is at work but needs to establish a doctor. SW contact Primary Care at [facility] and patient is scheduled 08/23/23 at 10:10am to be seen. Patient is informed that Medicaid Transportation can assist with a ride to the appointment. SW is unable to complete assessment as patient is at work and has limited time.  SDOH Interventions    Flowsheet Row Patient Outreach Telephone from 08/09/2023 in [facility] ED to Hosp-Admission (Discharged) from 11/12/2021 in [facility]  SDOH Interventions    Food Insecurity Interventions Intervention Not Indicated Intervention Not Indicated  Housing Interventions Intervention Not Indicated Intervention Not Indicated  Transportation Interventions Patient Resources (Friends/Family), Other (Comment)  [Medicaid transportation] Intervention Not Indicated  Utilities Interventions Intervention Not Indicated --     Cognitive        Neurological      HEENT        Cardiovascular      Respiratory      Endocrine      Gastrointestinal        Genitourinary      Integumentary      Musculoskeletal          Psychosocial               No data to display          There were no vitals filed for this visit.  Medications Reviewed Today   Medications were not reviewed in this encounter     Recommendation:   None  Follow Up Plan:   Patient has met all care management goals. Care Management case will be closed. Patient has been provided contact information should new needs arise.    Tillman Gardener, BSW   [facility], [facility] Social Worker Direct Dial: [phone]  Fax: [phone] Website: [email or web address]

## 2023-08-23 ENCOUNTER — Ambulatory Visit: Admitting: Family Medicine
